# Patient Record
Sex: Male | Born: 1935 | Race: White | Hispanic: No | Marital: Married | Smoking: Former smoker
Health system: Southern US, Community
[De-identification: ages and names within clinical notes are randomized; demographics above are authoritative.]

## PROBLEM LIST (undated history)

## (undated) DIAGNOSIS — I10 Essential (primary) hypertension: Secondary | ICD-10-CM

## (undated) DIAGNOSIS — N529 Male erectile dysfunction, unspecified: Secondary | ICD-10-CM

## (undated) DIAGNOSIS — M1990 Unspecified osteoarthritis, unspecified site: Secondary | ICD-10-CM

## (undated) DIAGNOSIS — M109 Gout, unspecified: Secondary | ICD-10-CM

## (undated) DIAGNOSIS — E291 Testicular hypofunction: Secondary | ICD-10-CM

## (undated) DIAGNOSIS — Z974 Presence of external hearing-aid: Secondary | ICD-10-CM

## (undated) DIAGNOSIS — Z950 Presence of cardiac pacemaker: Secondary | ICD-10-CM

## (undated) DIAGNOSIS — N138 Other obstructive and reflux uropathy: Secondary | ICD-10-CM

## (undated) DIAGNOSIS — I4891 Unspecified atrial fibrillation: Secondary | ICD-10-CM

## (undated) DIAGNOSIS — H3530 Unspecified macular degeneration: Secondary | ICD-10-CM

## (undated) DIAGNOSIS — R001 Bradycardia, unspecified: Secondary | ICD-10-CM

## (undated) DIAGNOSIS — E785 Hyperlipidemia, unspecified: Secondary | ICD-10-CM

## (undated) DIAGNOSIS — J984 Other disorders of lung: Secondary | ICD-10-CM

## (undated) DIAGNOSIS — I429 Cardiomyopathy, unspecified: Secondary | ICD-10-CM

## (undated) DIAGNOSIS — G4733 Obstructive sleep apnea (adult) (pediatric): Secondary | ICD-10-CM

## (undated) DIAGNOSIS — J439 Emphysema, unspecified: Secondary | ICD-10-CM

## (undated) DIAGNOSIS — C4490 Unspecified malignant neoplasm of skin, unspecified: Secondary | ICD-10-CM

## (undated) HISTORY — PX: NECK SURGERY: SHX720

## (undated) HISTORY — DX: Obstructive sleep apnea (adult) (pediatric): G47.33

## (undated) HISTORY — PX: JOINT REPLACEMENT: SHX530

## (undated) HISTORY — DX: Emphysema, unspecified: J43.9

## (undated) HISTORY — DX: Unspecified macular degeneration: H35.30

## (undated) HISTORY — DX: Male erectile dysfunction, unspecified: N52.9

## (undated) HISTORY — DX: Presence of cardiac pacemaker: Z95.0

## (undated) HISTORY — DX: Unspecified osteoarthritis, unspecified site: M19.90

## (undated) HISTORY — DX: Bradycardia, unspecified: R00.1

## (undated) HISTORY — PX: EYE SURGERY: SHX253

## (undated) HISTORY — DX: Benign prostatic hyperplasia with lower urinary tract symptoms: N13.8

## (undated) HISTORY — PX: COLONOSCOPY: SHX174

## (undated) HISTORY — DX: Testicular hypofunction: E29.1

## (undated) HISTORY — PX: OTHER SURGICAL HISTORY: SHX169

## (undated) HISTORY — PX: CARDIAC PACEMAKER PLACEMENT: SHX583

## (undated) HISTORY — DX: Presence of external hearing-aid: Z97.4

## (undated) HISTORY — DX: Gout, unspecified: M10.9

## (undated) HISTORY — PX: PACEMAKER INSERTION: SHX728

## (undated) HISTORY — DX: Other disorders of lung: J98.4

## (undated) HISTORY — DX: Cardiomyopathy, unspecified: I42.9

## (undated) HISTORY — PX: REPLACEMENT TOTAL KNEE: SUR1224

## (undated) HISTORY — PX: VASECTOMY: SHX75

## (undated) HISTORY — DX: Unspecified malignant neoplasm of skin, unspecified: C44.90

## (undated) HISTORY — DX: Hyperlipidemia, unspecified: E78.5

---

## 2013-05-14 DIAGNOSIS — I872 Venous insufficiency (chronic) (peripheral): Secondary | ICD-10-CM | POA: Insufficient documentation

## 2013-05-14 DIAGNOSIS — E291 Testicular hypofunction: Secondary | ICD-10-CM | POA: Insufficient documentation

## 2013-05-14 DIAGNOSIS — N400 Enlarged prostate without lower urinary tract symptoms: Secondary | ICD-10-CM | POA: Insufficient documentation

## 2013-05-14 DIAGNOSIS — R7303 Prediabetes: Secondary | ICD-10-CM | POA: Insufficient documentation

## 2013-05-14 DIAGNOSIS — M1712 Unilateral primary osteoarthritis, left knee: Secondary | ICD-10-CM | POA: Insufficient documentation

## 2013-05-14 DIAGNOSIS — Z950 Presence of cardiac pacemaker: Secondary | ICD-10-CM | POA: Insufficient documentation

## 2013-05-14 DIAGNOSIS — K760 Fatty (change of) liver, not elsewhere classified: Secondary | ICD-10-CM | POA: Insufficient documentation

## 2013-05-14 DIAGNOSIS — K5730 Diverticulosis of large intestine without perforation or abscess without bleeding: Secondary | ICD-10-CM | POA: Insufficient documentation

## 2013-05-14 DIAGNOSIS — Z86010 Personal history of colonic polyps: Secondary | ICD-10-CM | POA: Insufficient documentation

## 2013-05-14 DIAGNOSIS — G5600 Carpal tunnel syndrome, unspecified upper limb: Secondary | ICD-10-CM | POA: Insufficient documentation

## 2013-05-14 DIAGNOSIS — I480 Paroxysmal atrial fibrillation: Secondary | ICD-10-CM | POA: Insufficient documentation

## 2013-05-14 DIAGNOSIS — I429 Cardiomyopathy, unspecified: Secondary | ICD-10-CM | POA: Insufficient documentation

## 2013-05-14 DIAGNOSIS — M109 Gout, unspecified: Secondary | ICD-10-CM | POA: Insufficient documentation

## 2013-05-14 DIAGNOSIS — N138 Other obstructive and reflux uropathy: Secondary | ICD-10-CM | POA: Insufficient documentation

## 2013-05-14 DIAGNOSIS — M5030 Other cervical disc degeneration, unspecified cervical region: Secondary | ICD-10-CM | POA: Insufficient documentation

## 2013-05-14 DIAGNOSIS — I495 Sick sinus syndrome: Secondary | ICD-10-CM | POA: Insufficient documentation

## 2013-05-14 DIAGNOSIS — I517 Cardiomegaly: Secondary | ICD-10-CM | POA: Insufficient documentation

## 2013-05-14 DIAGNOSIS — Z7901 Long term (current) use of anticoagulants: Secondary | ICD-10-CM | POA: Insufficient documentation

## 2013-08-06 DIAGNOSIS — N529 Male erectile dysfunction, unspecified: Secondary | ICD-10-CM | POA: Insufficient documentation

## 2013-10-05 DIAGNOSIS — M1710 Unilateral primary osteoarthritis, unspecified knee: Secondary | ICD-10-CM | POA: Insufficient documentation

## 2013-10-05 DIAGNOSIS — E538 Deficiency of other specified B group vitamins: Secondary | ICD-10-CM | POA: Insufficient documentation

## 2013-10-05 DIAGNOSIS — E876 Hypokalemia: Secondary | ICD-10-CM | POA: Insufficient documentation

## 2013-11-02 ENCOUNTER — Emergency Department (HOSPITAL_BASED_OUTPATIENT_CLINIC_OR_DEPARTMENT_OTHER): Payer: Medicare Other

## 2013-11-02 ENCOUNTER — Encounter (HOSPITAL_BASED_OUTPATIENT_CLINIC_OR_DEPARTMENT_OTHER): Payer: Self-pay | Admitting: Emergency Medicine

## 2013-11-02 ENCOUNTER — Emergency Department (HOSPITAL_BASED_OUTPATIENT_CLINIC_OR_DEPARTMENT_OTHER)
Admission: EM | Admit: 2013-11-02 | Discharge: 2013-11-02 | Disposition: A | Discharge status: Home / Self Care | Payer: Medicare Other | Attending: Emergency Medicine | Admitting: Emergency Medicine

## 2013-11-02 DIAGNOSIS — Z7902 Long term (current) use of antithrombotics/antiplatelets: Secondary | ICD-10-CM | POA: Diagnosis not present

## 2013-11-02 DIAGNOSIS — Z23 Encounter for immunization: Secondary | ICD-10-CM | POA: Insufficient documentation

## 2013-11-02 DIAGNOSIS — Y9289 Other specified places as the place of occurrence of the external cause: Secondary | ICD-10-CM | POA: Diagnosis not present

## 2013-11-02 DIAGNOSIS — S0990XA Unspecified injury of head, initial encounter: Secondary | ICD-10-CM | POA: Insufficient documentation

## 2013-11-02 DIAGNOSIS — Z79899 Other long term (current) drug therapy: Secondary | ICD-10-CM | POA: Diagnosis not present

## 2013-11-02 DIAGNOSIS — I10 Essential (primary) hypertension: Secondary | ICD-10-CM | POA: Insufficient documentation

## 2013-11-02 DIAGNOSIS — I4891 Unspecified atrial fibrillation: Secondary | ICD-10-CM | POA: Insufficient documentation

## 2013-11-02 DIAGNOSIS — Y9389 Activity, other specified: Secondary | ICD-10-CM | POA: Diagnosis not present

## 2013-11-02 DIAGNOSIS — Z87891 Personal history of nicotine dependence: Secondary | ICD-10-CM | POA: Diagnosis not present

## 2013-11-02 DIAGNOSIS — S0101XA Laceration without foreign body of scalp, initial encounter: Secondary | ICD-10-CM

## 2013-11-02 DIAGNOSIS — W1809XA Striking against other object with subsequent fall, initial encounter: Secondary | ICD-10-CM | POA: Diagnosis not present

## 2013-11-02 DIAGNOSIS — IMO0002 Reserved for concepts with insufficient information to code with codable children: Secondary | ICD-10-CM | POA: Insufficient documentation

## 2013-11-02 DIAGNOSIS — S80212A Abrasion, left knee, initial encounter: Secondary | ICD-10-CM

## 2013-11-02 DIAGNOSIS — W19XXXA Unspecified fall, initial encounter: Secondary | ICD-10-CM

## 2013-11-02 DIAGNOSIS — S0100XA Unspecified open wound of scalp, initial encounter: Secondary | ICD-10-CM | POA: Diagnosis not present

## 2013-11-02 HISTORY — DX: Unspecified atrial fibrillation: I48.91

## 2013-11-02 HISTORY — DX: Essential (primary) hypertension: I10

## 2013-11-02 LAB — PROTIME-INR
INR: 2.22 — ABNORMAL HIGH (ref 0.00–1.49)
Prothrombin Time: 24.6 seconds — ABNORMAL HIGH (ref 11.6–15.2)

## 2013-11-02 MED ORDER — TETANUS-DIPHTH-ACELL PERTUSSIS 5-2.5-18.5 LF-MCG/0.5 IM SUSP
0.5000 mL | Freq: Once | INTRAMUSCULAR | Status: AC
  Administered 2013-11-02: 0.5 mL via INTRAMUSCULAR
  Filled 2013-11-02: qty 0.5

## 2013-11-02 MED ORDER — TRAMADOL HCL 50 MG PO TABS: 50.0000 mg | ORAL_TABLET | Freq: Four times a day (QID) | ORAL | Status: DC | PRN

## 2013-11-02 NOTE — ED Notes (Signed)
Tripped and fell while ambulating on stone steps this am.  Scalp laceration on knee abrasion.  Denies LOC.

## 2013-11-02 NOTE — ED Provider Notes (Signed)
CSN: [id number]     Arrival date & time 11/02/13  8242 History   First MD Initiated Contact with Patient 11/02/13 [phone]     Chief Complaint  Patient presents with  . Fall     (Consider location/radiation/quality/duration/timing/severity/associated sxs/prior Treatment) Patient is a 79 y.o. male presenting with fall. The history is provided by the patient and the spouse.  Fall Associated symptoms include headaches. Pertinent negatives include no chest pain, no abdominal pain and no shortness of breath.   patient tripped and fell outside striking the left side of his for head and his left knee. Questionable brief period of loss of consciousness. Patient has a history of atrial for relation and is on a blood thinner. It is not Coumadin or warfarin. Fall occurred at 7:30 this morning. Patient able to get back up and walk on his own. No syncope episode. Patient tetanus is not up-to-date.  Past Medical History  Diagnosis Date  . Hypertension   . Atrial fibrillation    Past Surgical History  Procedure Laterality Date  . Pacemaker insertion    . Cryoablation    . Replacement total knee    . Eye surgery    . Neck surgery     No family history on file. History  Substance Use Topics  . Smoking status: Former Research scientist (life sciences)  . Smokeless tobacco: Not on file  . Alcohol Use: Yes     Comment: 2-3 drinks daily    Review of Systems  Constitutional: Negative for fever.  HENT: Negative for congestion and nosebleeds.   Eyes: Negative for visual disturbance.  Respiratory: Negative for shortness of breath.   Cardiovascular: Negative for chest pain.  Gastrointestinal: Negative for abdominal pain.  Genitourinary: Negative for hematuria.  Musculoskeletal: Negative for back pain and neck pain.  Skin: Positive for wound.  Neurological: Positive for headaches.  Hematological: Bruises/bleeds easily.  Psychiatric/Behavioral: Negative for confusion.      Allergies  Hctz and Lisinopril  Home  Medications   Prior to Admission medications   Medication Sig Start Date End Date Taking? Authorizing Provider  cholecalciferol (VITAMIN D) 1000 UNITS tablet Take 1,000 Units by mouth daily.   Yes Historical Provider, MD  Dabigatran Etexilate Mesylate (PRADAXA PO) Take by mouth.   Yes Historical Provider, MD  fluticasone (VERAMYST) 27.5 MCG/SPRAY nasal spray Place 2 sprays into the nose daily.   Yes Historical Provider, MD  losartan (COZAAR) 100 MG tablet Take 100 mg by mouth daily.   Yes Historical Provider, MD  potassium chloride (K-DUR) 10 MEQ tablet Take 10 mEq by mouth daily.   Yes Historical Provider, MD  Sildenafil Citrate (VIAGRA PO) Take by mouth.   Yes Historical Provider, MD  testosterone enanthate (DELATESTRYL) 200 MG/ML injection Inject into the muscle every 14 (fourteen) days. For IM use only   Yes Historical Provider, MD  traMADol (ULTRAM) 50 MG tablet Take 1 tablet (50 mg total) by mouth every 6 (six) hours as needed. 11/02/13   Fredia Sorrow, MD   BP 134/63  Pulse 74  Temp(Src) 98.4 F (36.9 C) (Oral)  Resp 18  SpO2 94% Physical Exam  Nursing note and vitals reviewed. Constitutional: He is oriented to person, place, and time. He appears well-developed and well-nourished.  HENT:  Head: Normocephalic.  Mouth/Throat: Oropharynx is clear and moist.  Left fore head temporal area with 5 cm laceration linear irregular no step off bleeding reasonably controlled. No other facial injuries  Eyes: Conjunctivae and EOM are normal. Pupils are equal, round,  and reactive to light.  Neck: Normal range of motion.  Cardiovascular: Normal rate and regular rhythm.   No murmur heard. Pulmonary/Chest: Effort normal and breath sounds normal.  Abdominal: Soft. Bowel sounds are normal.  Musculoskeletal: Normal range of motion. He exhibits edema and tenderness.  5-6 cm abrasion to left anterior knee with some swelling no dislocation good range of motion.  Neurological: He is alert and  oriented to person, place, and time. No cranial nerve deficit. He exhibits normal muscle tone. Coordination normal.  Skin: Skin is warm. No rash noted.    ED Course  Procedures (including critical care time) Labs Review Labs Reviewed  PROTIME-INR - Abnormal; Notable for the following:    Prothrombin Time 24.6 (*)    INR 2.22 (*)    All other components within normal limits    Imaging Review Ct Head Wo Contrast  11/02/2013   CLINICAL DATA:  Fall, forehead laceration next  EXAM: CT HEAD WITHOUT CONTRAST  CT CERVICAL SPINE WITHOUT CONTRAST  TECHNIQUE: Multidetector CT imaging of the head and cervical spine was performed following the standard protocol without intravenous contrast. Multiplanar CT image reconstructions of the cervical spine were also generated.  COMPARISON:  Prior MRI cervical spine 08/10/2008  FINDINGS: CT HEAD FINDINGS  Negative for acute intracranial hemorrhage, acute infarction, mass, mass effect, hydrocephalus or midline shift. Gray-white differentiation is preserved throughout. Very mild cerebral volume loss. Trace periventricular white matter hypoattenuation consistent with longstanding microvascular ischemia. Soft tissue defect left frontotemporal scalp consistent with laceration. There is minimal underlying scalp hematoma. No calvarial fracture. Hyperostosis frontalis interna. Normal aeration of the mastoid air cells and visualized paranasal sinuses.  CT CERVICAL SPINE FINDINGS  No acute fracture, malalignment or prevertebral soft tissue swelling. Posterior cervical fusion with bilateral pedicle screw and rod construct spanning C3- C6. Laminectomies are present at each level of the fused segment. No evidence of hardware fracture or malalignment. Multilevel cervical spondylosis. No interval change in alignment comparing across modalities to the prior study. Persistent mild cervical stenosis at C2-C3 and C6-C7. Unremarkable CT appearance of the thyroid gland. No acute soft tissue  abnormality. The lung apices are unremarkable.  IMPRESSION: CT HEAD  1. No acute intracranial abnormality. 2. Left frontotemporal laceration with mild underlying hematoma. No evidence of calvarial fracture. 3. Very mild atrophy and sequelae of chronic microvascular ischemic white matter disease. CT CSPINE  1. No acute fracture or malalignment. 2. Surgical changes of prior C3-C6 multilevel laminectomies and posterior fusion without evidence of hardware complication.   Electronically Signed   By: Jacqulynn Cadet M.D.   On: 11/02/2013 09:35   Ct Cervical Spine Wo Contrast  11/02/2013   CLINICAL DATA:  Fall, forehead laceration next  EXAM: CT HEAD WITHOUT CONTRAST  CT CERVICAL SPINE WITHOUT CONTRAST  TECHNIQUE: Multidetector CT imaging of the head and cervical spine was performed following the standard protocol without intravenous contrast. Multiplanar CT image reconstructions of the cervical spine were also generated.  COMPARISON:  Prior MRI cervical spine 08/10/2008  FINDINGS: CT HEAD FINDINGS  Negative for acute intracranial hemorrhage, acute infarction, mass, mass effect, hydrocephalus or midline shift. Gray-white differentiation is preserved throughout. Very mild cerebral volume loss. Trace periventricular white matter hypoattenuation consistent with longstanding microvascular ischemia. Soft tissue defect left frontotemporal scalp consistent with laceration. There is minimal underlying scalp hematoma. No calvarial fracture. Hyperostosis frontalis interna. Normal aeration of the mastoid air cells and visualized paranasal sinuses.  CT CERVICAL SPINE FINDINGS  No acute fracture, malalignment or prevertebral  soft tissue swelling. Posterior cervical fusion with bilateral pedicle screw and rod construct spanning C3- C6. Laminectomies are present at each level of the fused segment. No evidence of hardware fracture or malalignment. Multilevel cervical spondylosis. No interval change in alignment comparing across  modalities to the prior study. Persistent mild cervical stenosis at C2-C3 and C6-C7. Unremarkable CT appearance of the thyroid gland. No acute soft tissue abnormality. The lung apices are unremarkable.  IMPRESSION: CT HEAD  1. No acute intracranial abnormality. 2. Left frontotemporal laceration with mild underlying hematoma. No evidence of calvarial fracture. 3. Very mild atrophy and sequelae of chronic microvascular ischemic white matter disease. CT CSPINE  1. No acute fracture or malalignment. 2. Surgical changes of prior C3-C6 multilevel laminectomies and posterior fusion without evidence of hardware complication.   Electronically Signed   By: Jacqulynn Cadet M.D.   On: 11/02/2013 09:35   Dg Knee Complete 4 Views Left  11/02/2013   CLINICAL DATA:  Anterior knee laceration, fall  EXAM: LEFT KNEE - COMPLETE 4+ VIEW  COMPARISON:  None.  FINDINGS: Moderately severe tricompartmental osteoarthritis, most pronounced in the lateral and patellofemoral compartments which demonstrate joint space loss, sclerosis and bony spurring. Normal alignment without fracture. Moderate joint effusion on the lateral view.  IMPRESSION: Osteoarthritis.  Joint effusion.  No acute osseous finding or fracture.   Electronically Signed   By: Daryll Brod M.D.   On: 11/02/2013 09:20     EKG Interpretation None     LACERATION REPAIR Performed by: Fredia Sorrow Authorized by: Fredia Sorrow Consent: Verbal consent obtained. Risks and benefits: risks, benefits and alternatives were discussed Consent given by: patient Patient identity confirmed: provided demographic data Prepped and Draped in normal sterile fashion Wound explored  Laceration Location: Left frontal temporal area of the scalp  Laceration Length: 5 cm  No Foreign Bodies seen or palpated  Anesthesia: local infiltration  Local anesthetic: None patient opted for none  Anesthetic total: None   Irrigation method: syringe Amount of cleaning:  standard  Skin closure: Skin staples   Number of sutures: 5   Technique: Simple interrupted   Patient tolerance: Patient tolerated the procedure well with no immediate complications.  MDM   Final diagnoses:  Fall, initial encounter  Scalp laceration, initial encounter  Head injury, initial encounter  Knee abrasion, left, initial encounter    Patient status post fall with head injury and left knee injury. Patient with significant laceration to the left frontal temporal area of the scalp laceration measuring about 5 cm. Head CT was negative, CT cervical spine negative, x-ray of left knee where there is an abrasion without acute injury does show some effusion could be related to the fall or could be related to his history of osteoarthritis. Patient without any other significant injuries. Patient given precautions to return for any new or worsening symptoms symptoms since he had a significant injury to the head and is on blood thinners for atrial fibrillation.    Fredia Sorrow, MD 11/02/13 1158

## 2013-11-02 NOTE — ED Notes (Signed)
Patient transported to CT 

## 2013-11-02 NOTE — Discharge Instructions (Signed)
Wound care as we discussed. Keep the scalp wound dry for 24 hours. Then you can shower in dress daily with an antibiotic ointment. Staple removal in 7-10 days. Return for any newer worse symptoms to include severe headache nausea vomiting or any confusion. Results of your head CT and x-rays provided.

## 2013-11-11 DIAGNOSIS — Z91030 Bee allergy status: Secondary | ICD-10-CM | POA: Insufficient documentation

## 2014-01-23 ENCOUNTER — Encounter (HOSPITAL_BASED_OUTPATIENT_CLINIC_OR_DEPARTMENT_OTHER): Payer: Self-pay | Admitting: *Deleted

## 2014-01-23 ENCOUNTER — Emergency Department (HOSPITAL_BASED_OUTPATIENT_CLINIC_OR_DEPARTMENT_OTHER): Payer: Medicare Other

## 2014-01-23 ENCOUNTER — Emergency Department (HOSPITAL_BASED_OUTPATIENT_CLINIC_OR_DEPARTMENT_OTHER)
Admission: EM | Admit: 2014-01-23 | Discharge: 2014-01-23 | Disposition: A | Discharge status: Home / Self Care | Payer: Medicare Other | Attending: Emergency Medicine | Admitting: Emergency Medicine

## 2014-01-23 DIAGNOSIS — Z950 Presence of cardiac pacemaker: Secondary | ICD-10-CM | POA: Diagnosis not present

## 2014-01-23 DIAGNOSIS — Z79899 Other long term (current) drug therapy: Secondary | ICD-10-CM | POA: Diagnosis not present

## 2014-01-23 DIAGNOSIS — Z7951 Long term (current) use of inhaled steroids: Secondary | ICD-10-CM | POA: Diagnosis not present

## 2014-01-23 DIAGNOSIS — Z87891 Personal history of nicotine dependence: Secondary | ICD-10-CM | POA: Insufficient documentation

## 2014-01-23 DIAGNOSIS — I10 Essential (primary) hypertension: Secondary | ICD-10-CM | POA: Insufficient documentation

## 2014-01-23 DIAGNOSIS — Z96659 Presence of unspecified artificial knee joint: Secondary | ICD-10-CM | POA: Insufficient documentation

## 2014-01-23 DIAGNOSIS — Z8739 Personal history of other diseases of the musculoskeletal system and connective tissue: Secondary | ICD-10-CM

## 2014-01-23 DIAGNOSIS — M25572 Pain in left ankle and joints of left foot: Secondary | ICD-10-CM | POA: Diagnosis present

## 2014-01-23 DIAGNOSIS — R52 Pain, unspecified: Secondary | ICD-10-CM

## 2014-01-23 MED ORDER — PREDNISONE 10 MG PO TABS: 20.0000 mg | ORAL_TABLET | Freq: Two times a day (BID) | ORAL | Status: DC

## 2014-01-23 MED ORDER — HYDROCODONE-ACETAMINOPHEN 5-325 MG PO TABS: 2.0000 | ORAL_TABLET | ORAL | Status: DC | PRN

## 2014-01-23 NOTE — ED Provider Notes (Signed)
CSN: [id number]     Arrival date & time 01/23/14  1004 History   First MD Initiated Contact with Patient 01/23/14 1042     Chief Complaint  Patient presents with  . Ankle Pain     (Consider location/radiation/quality/duration/timing/severity/associated sxs/prior Treatment) HPI Comments: Patient is a 79 year old male with history of pacemaker, total knee replacement. He presents today with complaints of left foot and ankle pain. This is been worsening over the past 3 days and is worse with bearing weight. It is improved with rest. He has had gout in the past and believes that is what is happening today. He denies any injury or trauma.  Patient is a 79 y.o. male presenting with ankle pain. The history is provided by the patient.  Ankle Pain Location:  Foot Time since incident:  3 days Injury: no   Foot location:  L foot Pain details:    Severity:  Moderate   Onset quality:  Gradual   Duration:  3 days   Timing:  Constant   Progression:  Worsening Chronicity:  New   Past Medical History  Diagnosis Date  . Hypertension   . Atrial fibrillation    Past Surgical History  Procedure Laterality Date  . Pacemaker insertion    . Cryoablation    . Replacement total knee    . Eye surgery    . Neck surgery     No family history on file. History  Substance Use Topics  . Smoking status: Former Research scientist (life sciences)  . Smokeless tobacco: Not on file  . Alcohol Use: Yes     Comment: 2-3 drinks daily    Review of Systems  All other systems reviewed and are negative.     Allergies  Hctz and Lisinopril  Home Medications   Prior to Admission medications   Medication Sig Start Date End Date Taking? Authorizing Provider  cholecalciferol (VITAMIN D) 1000 UNITS tablet Take 1,000 Units by mouth daily.    Historical Provider, MD  Dabigatran Etexilate Mesylate (PRADAXA PO) Take by mouth.    Historical Provider, MD  fluticasone (VERAMYST) 27.5 MCG/SPRAY nasal spray Place 2 sprays into the nose  daily.    Historical Provider, MD  losartan (COZAAR) 100 MG tablet Take 100 mg by mouth daily.    Historical Provider, MD  potassium chloride (K-DUR) 10 MEQ tablet Take 10 mEq by mouth daily.    Historical Provider, MD  Sildenafil Citrate (VIAGRA PO) Take by mouth.    Historical Provider, MD  testosterone enanthate (DELATESTRYL) 200 MG/ML injection Inject into the muscle every 14 (fourteen) days. For IM use only    Historical Provider, MD  traMADol (ULTRAM) 50 MG tablet Take 1 tablet (50 mg total) by mouth every 6 (six) hours as needed. 11/02/13   Fredia Sorrow, MD   BP 144/83 mmHg  Pulse 75  Temp(Src) 98 F (36.7 C) (Oral)  Resp 20  Ht 6\' 1"  (1.854 m)  Wt 250 lb (113.399 kg)  BMI 32.99 kg/m2  SpO2 96% Physical Exam  Constitutional: He is oriented to person, place, and time. He appears well-developed and well-nourished. No distress.  HENT:  Head: Normocephalic and atraumatic.  Neck: Normal range of motion. Neck supple.  Musculoskeletal: Normal range of motion. He exhibits no edema.  The left foot an ankle appear grossly normal.  There is tenderness to palpation around the soft tissues inferior to the medial and lateral malleolus. There is pain with range of motion of the ankle, however no warmth or palpable  joint effusion.  Neurological: He is alert and oriented to person, place, and time.  Skin: Skin is warm and dry. He is not diaphoretic.  Nursing note and vitals reviewed.   ED Course  Procedures (including critical care time) Labs Review Labs Reviewed - No data to display  Imaging Review Dg Ankle Complete Left  01/23/2014   CLINICAL DATA:  79 year old male with lateral left ankle pain and swelling. No known injury.  EXAM: LEFT ANKLE COMPLETE - 3+ VIEW  COMPARISON:  None.  FINDINGS: Diffuse mild soft tissue swelling about the ankle without evidence of acute fracture or malalignment. The ankle mortise remains congruent and the talar dome intact. Normal bony mineralization without  evidence of lytic or blastic osseous lesion. No ankle joint effusion. Mildly prominent posterior talar process. Small posterior and plantar calcaneal spurs. Trace atherosclerotic calcifications noted in the posterior tibial and dorsalis pedis arteries. Mild degenerative change in the mid foot at the talonavicular joint.  IMPRESSION: 1. Mild soft tissue swelling about the ankle without evidence of acute osseous abnormality or ankle joint effusion. 2. Mild midfoot degenerative osteoarthritis. 3. Mild atherosclerotic vascular calcifications.   Electronically Signed   By: Jacqulynn Cadet M.D.   On: 01/23/2014 10:33     EKG Interpretation None      MDM   Final diagnoses:  Pain    X-rays are negative. I will treat for possible gout with prednisone and when necessary follow-up.    Veryl Speak, MD 01/23/14 1054

## 2014-01-23 NOTE — ED Notes (Signed)
Left ankle pain and swelling. Denies recent injury

## 2014-01-23 NOTE — Discharge Instructions (Signed)
Prednisone as prescribed.  Hydrocodone as prescribed as needed for pain.  Follow-up with your primary Dr. If not improving in the next week, and return to the ER if your symptoms substantially worsen or change.   Ankle Pain Ankle pain is a common symptom. The bones, cartilage, tendons, and muscles of the ankle joint perform a lot of work each day. The ankle joint holds your body weight and allows you to move around. Ankle pain can occur on either side or back of 1 or both ankles. Ankle pain may be sharp and burning or dull and aching. There may be tenderness, stiffness, redness, or warmth around the ankle. The pain occurs more often when a person walks or puts pressure on the ankle. CAUSES  There are many reasons ankle pain can develop. It is important to work with your caregiver to identify the cause since many conditions can impact the bones, cartilage, muscles, and tendons. Causes for ankle pain include:  Injury, including a break (fracture), sprain, or strain often due to a fall, sports, or a high-impact activity.  Swelling (inflammation) of a tendon (tendonitis).  Achilles tendon rupture.  Ankle instability after repeated sprains and strains.  Poor foot alignment.  Pressure on a nerve (tarsal tunnel syndrome).  Arthritis in the ankle or the lining of the ankle.  Crystal formation in the ankle (gout or pseudogout). DIAGNOSIS  A diagnosis is based on your medical history, your symptoms, results of your physical exam, and results of diagnostic tests. Diagnostic tests may include X-ray exams or a computerized magnetic scan (magnetic resonance imaging, MRI). TREATMENT  Treatment will depend on the cause of your ankle pain and may include:  Keeping pressure off the ankle and limiting activities.  Using crutches or other walking support (a cane or brace).  Using rest, ice, compression, and elevation.  Participating in physical therapy or home exercises.  Wearing shoe inserts or  special shoes.  Losing weight.  Taking medications to reduce pain or swelling or receiving an injection.  Undergoing surgery. HOME CARE INSTRUCTIONS   Only take over-the-counter or prescription medicines for pain, discomfort, or fever as directed by your caregiver.  Put ice on the injured area.  Put ice in a plastic bag.  Place a towel between your skin and the bag.  Leave the ice on for 15-20 minutes at a time, 03-04 times a day.  Keep your leg raised (elevated) when possible to lessen swelling.  Avoid activities that cause ankle pain.  Follow specific exercises as directed by your caregiver.  Record how often you have ankle pain, the location of the pain, and what it feels like. This information may be helpful to you and your caregiver.  Ask your caregiver about returning to work or sports and whether you should drive.  Follow up with your caregiver for further examination, therapy, or testing as directed. SEEK MEDICAL CARE IF:   Pain or swelling continues or worsens beyond 1 week.  You have an oral temperature above 102 F (38.9 C).  You are feeling unwell or have chills.  You are having an increasingly difficult time with walking.  You have loss of sensation or other new symptoms.  You have questions or concerns. MAKE SURE YOU:   Understand these instructions.  Will watch your condition.  Will get help right away if you are not doing well or get worse. Document Released: 08/22/2009 Document Revised: 05/27/2011 Document Reviewed: 08/22/2009 [facility] Patient Information 2015 [place], [place]. This information is not intended  to replace advice given to you by your health care provider. Make sure you discuss any questions you have with your health care provider. ° °

## 2014-02-21 DIAGNOSIS — I10 Essential (primary) hypertension: Secondary | ICD-10-CM | POA: Insufficient documentation

## 2014-02-24 DIAGNOSIS — M19072 Primary osteoarthritis, left ankle and foot: Secondary | ICD-10-CM | POA: Insufficient documentation

## 2014-08-23 DIAGNOSIS — E785 Hyperlipidemia, unspecified: Secondary | ICD-10-CM | POA: Insufficient documentation

## 2014-10-31 DIAGNOSIS — G4733 Obstructive sleep apnea (adult) (pediatric): Secondary | ICD-10-CM | POA: Insufficient documentation

## 2015-08-30 IMAGING — CR DG ANKLE COMPLETE 3+V*L*
3 series · 3 of 3 positions shown · non-contrast
Comparison: None.

CLINICAL DATA: 79-year-old male with lateral left ankle pain and
swelling. No known injury.

EXAM:
LEFT ANKLE COMPLETE - 3+ VIEW

[t ankle joint ap left]
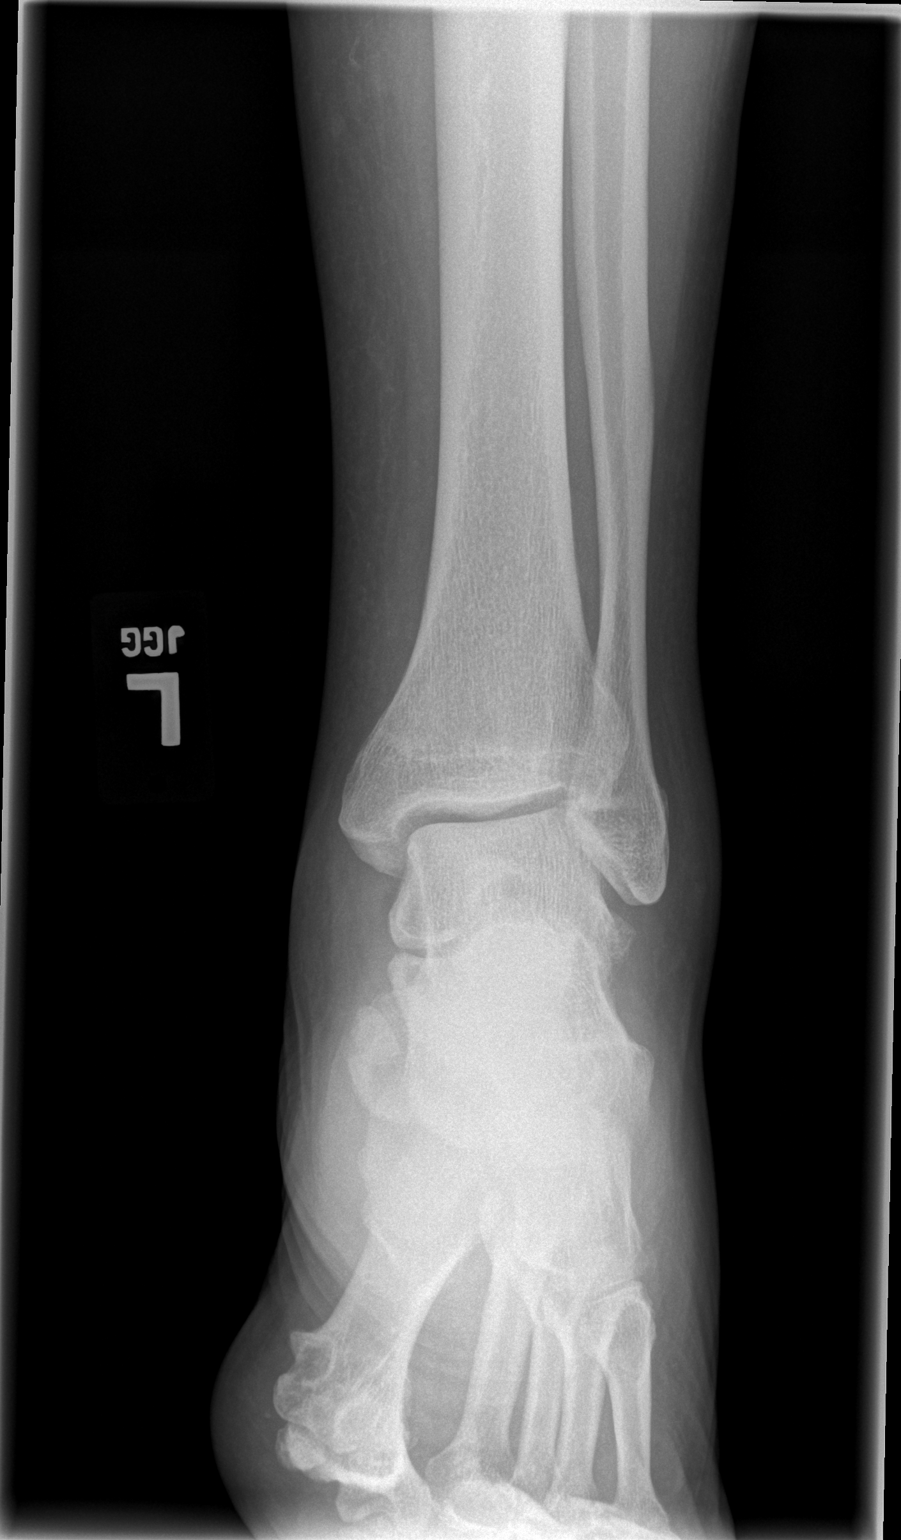

[t ankle joint oblique left]
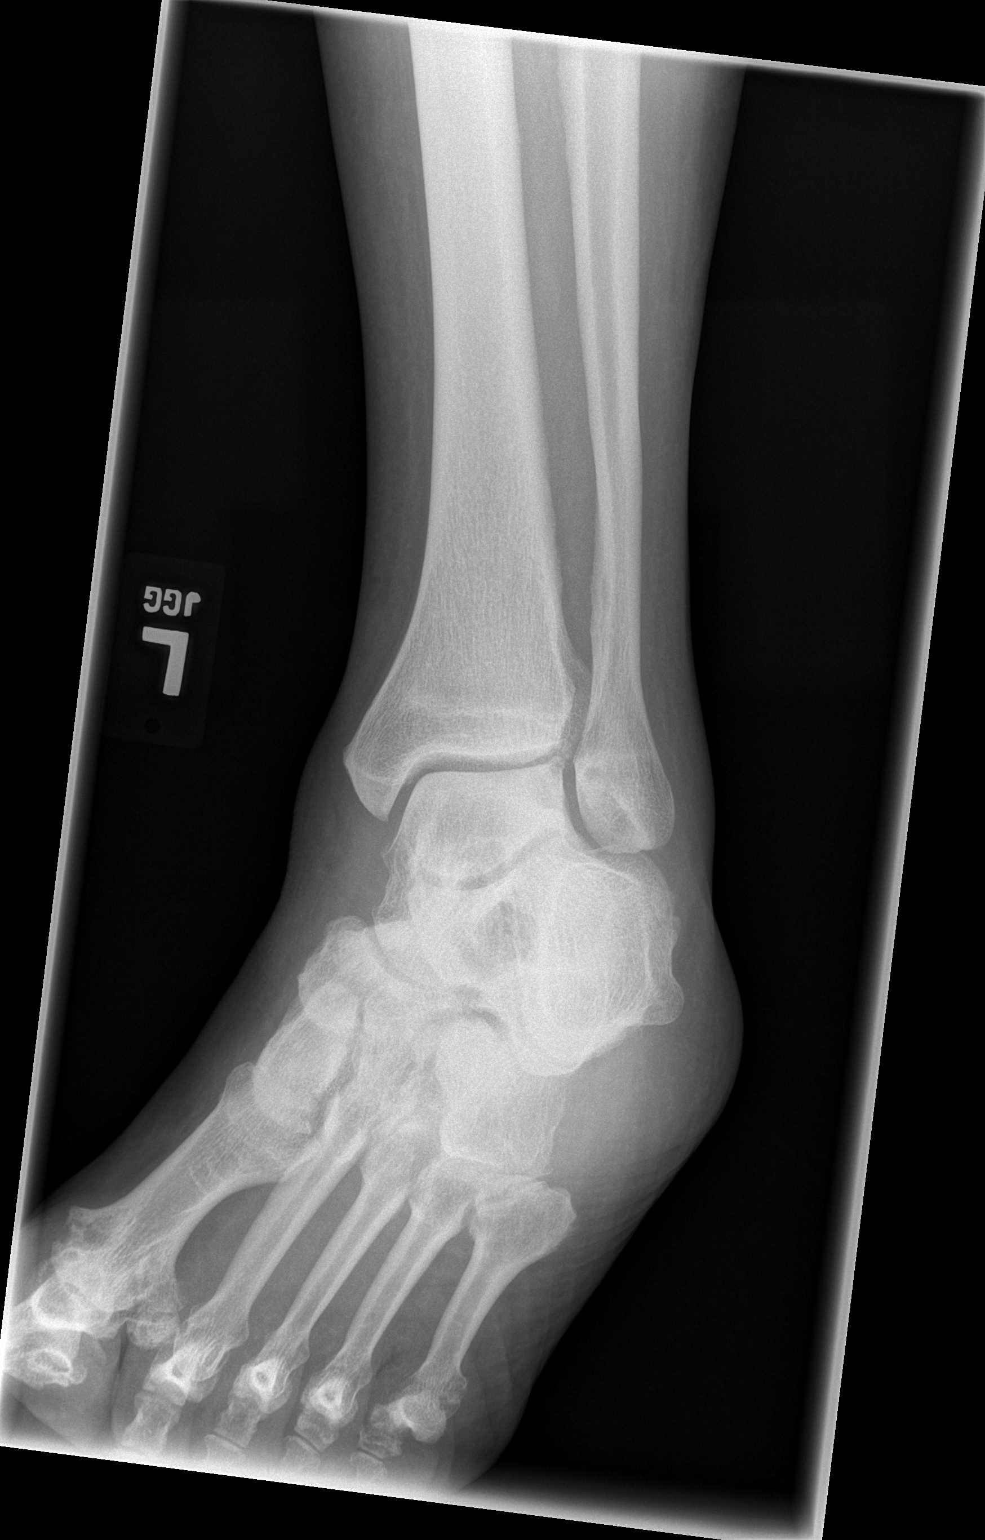

[t ankle joint lat left]
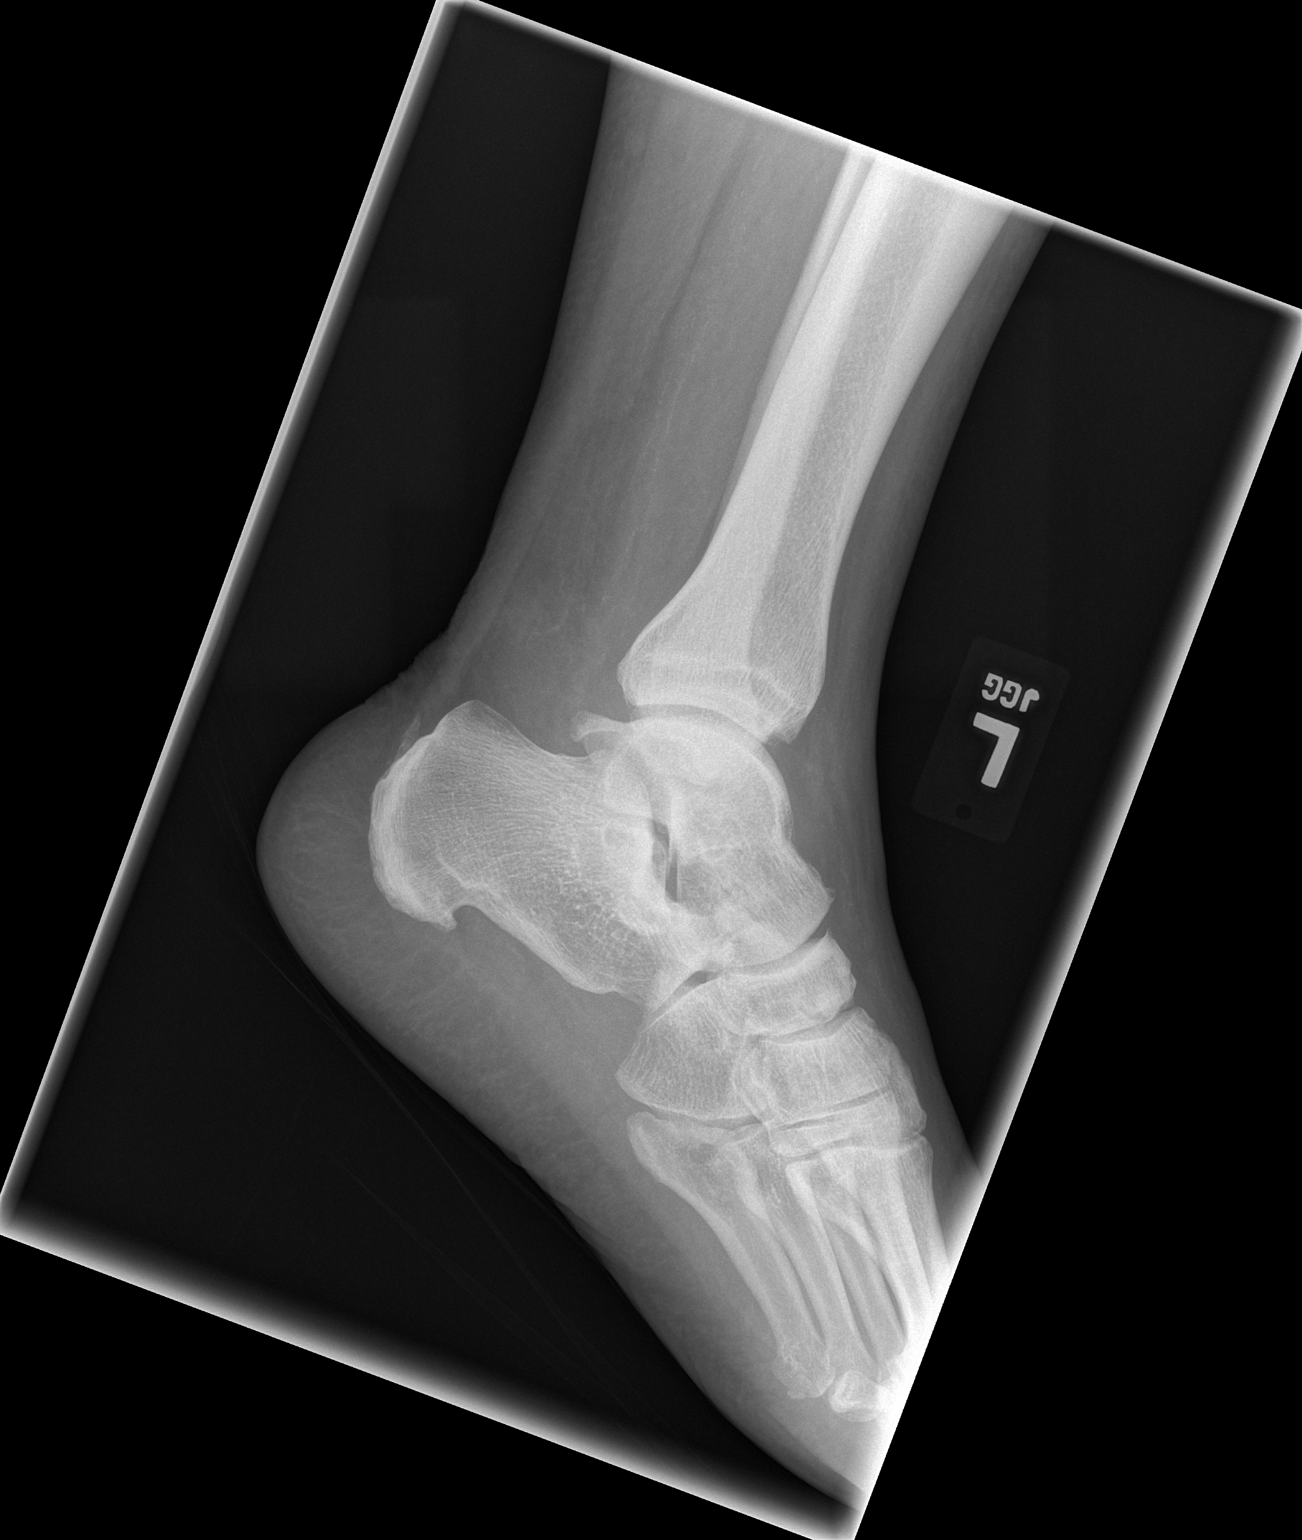

[3 of 3 positions shown; findings below may reference images not displayed]

FINDINGS: Diffuse mild soft tissue swelling about the ankle without evidence
of acute fracture or malalignment. The ankle mortise remains
congruent and the talar dome intact. Normal bony mineralization
without evidence of lytic or blastic osseous lesion. No ankle joint
effusion. Mildly prominent posterior talar process. Small posterior
and plantar calcaneal spurs. Trace atherosclerotic calcifications
noted in the posterior tibial and dorsalis pedis arteries. Mild
degenerative change in the mid foot at the talonavicular joint.
IMPRESSION: 1. Mild soft tissue swelling about the ankle without evidence of
acute osseous abnormality or ankle joint effusion.
2. Mild midfoot degenerative osteoarthritis.
3. Mild atherosclerotic vascular calcifications.

## 2016-12-17 DIAGNOSIS — Z45018 Encounter for adjustment and management of other part of cardiac pacemaker: Secondary | ICD-10-CM | POA: Insufficient documentation

## 2017-05-25 ENCOUNTER — Encounter (HOSPITAL_BASED_OUTPATIENT_CLINIC_OR_DEPARTMENT_OTHER): Payer: Self-pay | Admitting: Emergency Medicine

## 2017-05-25 ENCOUNTER — Emergency Department (HOSPITAL_BASED_OUTPATIENT_CLINIC_OR_DEPARTMENT_OTHER)
Admission: EM | Admit: 2017-05-25 | Discharge: 2017-05-25 | Disposition: A | Discharge status: Home / Self Care | Payer: Medicare Other | Attending: Emergency Medicine | Admitting: Emergency Medicine

## 2017-05-25 ENCOUNTER — Emergency Department (HOSPITAL_BASED_OUTPATIENT_CLINIC_OR_DEPARTMENT_OTHER): Payer: Medicare Other

## 2017-05-25 ENCOUNTER — Other Ambulatory Visit: Payer: Self-pay

## 2017-05-25 DIAGNOSIS — I4891 Unspecified atrial fibrillation: Secondary | ICD-10-CM | POA: Diagnosis not present

## 2017-05-25 DIAGNOSIS — Z79899 Other long term (current) drug therapy: Secondary | ICD-10-CM | POA: Diagnosis not present

## 2017-05-25 DIAGNOSIS — I10 Essential (primary) hypertension: Secondary | ICD-10-CM | POA: Diagnosis not present

## 2017-05-25 DIAGNOSIS — Z87891 Personal history of nicotine dependence: Secondary | ICD-10-CM | POA: Diagnosis not present

## 2017-05-25 DIAGNOSIS — Z950 Presence of cardiac pacemaker: Secondary | ICD-10-CM | POA: Diagnosis not present

## 2017-05-25 DIAGNOSIS — R002 Palpitations: Secondary | ICD-10-CM | POA: Diagnosis present

## 2017-05-25 LAB — CBC WITH DIFFERENTIAL/PLATELET
BASOS PCT: 0 %
Basophils Absolute: 0 10*3/uL (ref 0.0–0.1)
EOS ABS: 0.2 10*3/uL (ref 0.0–0.7)
EOS PCT: 2 %
HEMATOCRIT: 40.6 % (ref 39.0–52.0)
Hemoglobin: 14.2 g/dL (ref 13.0–17.0)
Lymphocytes Relative: 12 %
Lymphs Abs: 1.3 10*3/uL (ref 0.7–4.0)
MCH: 33.5 pg (ref 26.0–34.0)
MCHC: 35 g/dL (ref 30.0–36.0)
MCV: 95.8 fL (ref 78.0–100.0)
MONO ABS: 0.7 10*3/uL (ref 0.1–1.0)
Monocytes Relative: 7 %
Neutro Abs: 8 10*3/uL — ABNORMAL HIGH (ref 1.7–7.7)
Neutrophils Relative %: 79 %
PLATELETS: 162 10*3/uL (ref 150–400)
RBC: 4.24 MIL/uL (ref 4.22–5.81)
RDW: 13.5 % (ref 11.5–15.5)
WBC: 10.1 10*3/uL (ref 4.0–10.5)

## 2017-05-25 LAB — COMPREHENSIVE METABOLIC PANEL
ALBUMIN: 4.4 g/dL (ref 3.5–5.0)
ALT: 26 U/L (ref 17–63)
ANION GAP: 10 (ref 5–15)
AST: 28 U/L (ref 15–41)
Alkaline Phosphatase: 97 U/L (ref 38–126)
BUN: 19 mg/dL (ref 6–20)
CHLORIDE: 104 mmol/L (ref 101–111)
CO2: 23 mmol/L (ref 22–32)
Calcium: 10.7 mg/dL — ABNORMAL HIGH (ref 8.9–10.3)
Creatinine, Ser: 1.18 mg/dL (ref 0.61–1.24)
GFR calc Af Amer: >60 mL/min (ref >60)
GFR calc non Af Amer: 56 mL/min — ABNORMAL LOW (ref >60)
GLUCOSE: 121 mg/dL — AB (ref 65–99)
POTASSIUM: 4.2 mmol/L (ref 3.5–5.1)
SODIUM: 137 mmol/L (ref 135–145)
TOTAL PROTEIN: 7.3 g/dL (ref 6.5–8.1)
Total Bilirubin: 2.1 mg/dL — ABNORMAL HIGH (ref 0.3–1.2)

## 2017-05-25 LAB — MAGNESIUM: Magnesium: 2 mg/dL (ref 1.7–2.4)

## 2017-05-25 LAB — TROPONIN I: Troponin I: 0.1 ng/mL (ref <0.03)

## 2017-05-25 MED ORDER — AMIODARONE IV BOLUS ONLY 150 MG/100ML
150.0000 mg | Freq: Once | INTRAVENOUS | Status: AC
  Administered 2017-05-25: 150 mg via INTRAVENOUS
  Filled 2017-05-25: qty 100

## 2017-05-25 MED ORDER — APIXABAN 5 MG PO TABS
5.0000 mg | ORAL_TABLET | Freq: Once | ORAL | Status: DC
  Filled 2017-05-25: qty 1

## 2017-05-25 MED ORDER — APIXABAN 5 MG PO TABS: 5.0000 mg | ORAL_TABLET | Freq: Two times a day (BID) | ORAL | 0 refills | Status: DC

## 2017-05-25 MED ORDER — AMIODARONE HCL 200 MG PO TABS: 200.0000 mg | ORAL_TABLET | Freq: Every day | ORAL | 0 refills | Status: DC

## 2017-05-25 MED ORDER — SODIUM CHLORIDE 0.9 % IV SOLN
INTRAVENOUS | Status: DC
  Administered 2017-05-25: 14:00:00 via INTRAVENOUS

## 2017-05-25 MED ORDER — DILTIAZEM HCL 100 MG IV SOLR
5.0000 mg/h | INTRAVENOUS | Status: DC
  Administered 2017-05-25: 5 mg/h via INTRAVENOUS
  Filled 2017-05-25: qty 100

## 2017-05-25 MED ORDER — DILTIAZEM LOAD VIA INFUSION
10.0000 mg | Freq: Once | INTRAVENOUS | Status: AC
  Administered 2017-05-25: 10 mg via INTRAVENOUS
  Filled 2017-05-25: qty 10

## 2017-05-25 NOTE — ED Notes (Signed)
Troponin 0.10 , given to ED MD

## 2017-05-25 NOTE — ED Notes (Signed)
Pt placed on O2 2L [place]; no resp distress noted, but O2 sats 90-91% with good wave form.

## 2017-05-25 NOTE — ED Provider Notes (Signed)
[facility] EMERGENCY DEPARTMENT Provider Note   CSN: [id number] Arrival date & time: 05/25/17  1238     History   Chief Complaint Chief Complaint  Patient presents with  . Palpitations    HPI Christian Lin is a 83 y.o. male.  The history is provided by the patient.  Palpitations   This is a new problem. Episode onset: present when waking up this morning. The problem occurs constantly. The problem has not changed since onset.The problem is associated with an unknown factor. On average, each episode lasts 6 hours. Pertinent negatives include no diaphoresis, no fever, no chest pain, no chest pressure, no exertional chest pressure, no near-syncope, no syncope, no abdominal pain, no nausea, no vomiting, no headaches, no leg pain, no lower extremity edema, no weakness, no cough and no shortness of breath. He has tried nothing for the symptoms. The treatment provided no relief. Risk factors: hx of PAF but had not had an episode in so long took off rate control and blood thinners 1 year ago. Past medical history comments: hx of PAF, pacemaker, HTN.    Past Medical History:  Diagnosis Date  . Atrial fibrillation ([place])   . Hypertension     There are no active problems to display for this patient.   Past Surgical History:  Procedure Laterality Date  . cryoablation    . EYE SURGERY    . NECK SURGERY    . PACEMAKER INSERTION    . REPLACEMENT TOTAL KNEE         Home Medications    Prior to Admission medications   Medication Sig Start Date End Date Taking? Authorizing Provider  amLODipine (NORVASC) 5 MG tablet Take 5 mg by mouth daily.   Yes [provider]  atorvastatin (LIPITOR) 40 MG tablet Take 40 mg by mouth daily.   Yes [provider]  finasteride (PROSCAR) 5 MG tablet Take 5 mg by mouth daily.   Yes [provider]  furosemide (LASIX) 20 MG tablet Take 20 mg by mouth.   Yes [provider]  cholecalciferol (VITAMIN D)  1000 UNITS tablet Take 1,000 Units by mouth daily.    [provider]  Dabigatran Etexilate Mesylate (PRADAXA PO) Take by mouth.    [provider]  fluticasone (VERAMYST) 27.5 MCG/SPRAY nasal spray Place 2 sprays into the nose daily.    [provider]  HYDROcodone-acetaminophen (NORCO) 5-325 MG per tablet Take 2 tablets by mouth every 4 (four) hours as needed. 01/23/14   Veryl Speak, MD  losartan (COZAAR) 100 MG tablet Take 100 mg by mouth daily.    [provider]  potassium chloride (K-DUR) 10 MEQ tablet Take 10 mEq by mouth daily.    [provider]  predniSONE (DELTASONE) 10 MG tablet Take 2 tablets (20 mg total) by mouth 2 (two) times daily. 01/23/14   Veryl Speak, MD  Sildenafil Citrate (VIAGRA PO) Take by mouth.    [provider]  testosterone enanthate (DELATESTRYL) 200 MG/ML injection Inject into the muscle every 14 (fourteen) days. For IM use only    [provider]  traMADol (ULTRAM) 50 MG tablet Take 1 tablet (50 mg total) by mouth every 6 (six) hours as needed. 11/02/13   Fredia Sorrow, MD    Family History No family history on file.  Social History Social History   Tobacco Use  . Smoking status: Former Research scientist (life sciences)  . Smokeless tobacco: Never Used  Substance Use Topics  . Alcohol use:  Yes    Comment: 2-3 drinks daily  . Drug use: No     Allergies   Hctz [hydrochlorothiazide] and Lisinopril   Review of Systems Review of Systems  Constitutional: Negative for diaphoresis and fever.  Respiratory: Negative for cough and shortness of breath.   Cardiovascular: Positive for palpitations. Negative for chest pain, syncope and near-syncope.  Gastrointestinal: Negative for abdominal pain, nausea and vomiting.  Neurological: Negative for weakness and headaches.  All other systems reviewed and are negative.    Physical Exam Updated Vital Signs BP 129/85   Pulse (!) 125   Temp 98.1 F (36.7 C) (Oral)    Resp 20   Ht 6\' 2"  (1.88 m)   Wt 115.7 kg (255 lb)   SpO2 98%   BMI 32.74 kg/m   Physical Exam  Constitutional: He is oriented to person, place, and time. He appears well-developed and well-nourished. No distress.  HENT:  Head: Normocephalic and atraumatic.  Mouth/Throat: Oropharynx is clear and moist.  Eyes: Conjunctivae and EOM are normal. Pupils are equal, round, and reactive to light.  Neck: Normal range of motion. Neck supple.  Cardiovascular: Regular rhythm and intact distal pulses. Tachycardia present.  No murmur heard. Pulmonary/Chest: Effort normal and breath sounds normal. No respiratory distress. He has no wheezes. He has no rales.  Abdominal: Soft. He exhibits no distension. There is no tenderness. There is no rebound and no guarding.  Musculoskeletal: Normal range of motion. He exhibits no edema or tenderness.  Neurological: He is alert and oriented to person, place, and time.  Skin: Skin is warm and dry. No rash noted. No erythema.  Psychiatric: He has a normal mood and affect. His behavior is normal.  Nursing note and vitals reviewed.    ED Treatments / Results  Labs (all labs ordered are listed, but only abnormal results are displayed) Labs Reviewed  CBC WITH DIFFERENTIAL/PLATELET - Abnormal; Notable for the following components:      Result Value   Neutro Abs 8.0 (*)    All other components within normal limits  COMPREHENSIVE METABOLIC PANEL - Abnormal; Notable for the following components:   Glucose, Bld 121 (*)    Calcium 10.7 (*)    Total Bilirubin 2.1 (*)    GFR calc non Af Amer 56 (*)    All other components within normal limits  TROPONIN I - Abnormal; Notable for the following components:   Troponin I 0.10 (*)    All other components within normal limits  MAGNESIUM    EKG  EKG Interpretation  Date/Time:  Sunday May 25 2017 12:44:35 EDT Ventricular Rate:  128 PR Interval:    QRS Duration: 132 QT Interval:  338 QTC Calculation: 493 R  Axis:   87 Text Interpretation:  Atrial flutter with variable A-V block with premature ventricular or aberrantly conducted complexes Right bundle branch block Septal infarct , age undetermined T wave abnormality, consider inferior ischemia No previous tracing Confirmed by Blanchie Dessert [phone]) on 05/25/2017 1:19:50 PM       Radiology Dg Chest Port 1 View  Result Date: 05/25/2017 CLINICAL DATA:  Palpitations, heart racing EXAM: PORTABLE CHEST 1 VIEW COMPARISON:  07/18/2016 FINDINGS: Left pacer remains in place, unchanged. Mild cardiomegaly. No confluent airspace opacities or effusions. No acute bony abnormality. IMPRESSION: Mild cardiomegaly.  No acute findings. Electronically Signed   By: Rolm Baptise M.D.   On: 05/25/2017 13:49    Procedures Procedures (including critical care time)  Medications Ordered in ED Medications  0.9 %  sodium chloride infusion (not administered)  diltiazem (CARDIZEM) 1 mg/mL load via infusion 10 mg (not administered)    And  diltiazem (CARDIZEM) 100 mg in dextrose 5 % 100 mL (1 mg/mL) infusion (not administered)     Initial Impression / Assessment and Plan / ED Course  I have reviewed the triage vital signs and the nursing notes.  Pertinent labs & imaging results that were available during my care of the patient were reviewed by me and considered in my medical decision making (see chart for details).     82 year old gentleman with a history of paroxysmal atrial fibrillation and prior pacemaker placement who has been taken off all rhythm controlling medications and anticoagulation approximately 1 year ago because he had had no further episodes presenting today with palpitations that started when he woke up this morning.  Patient denies any chest pain, new shortness of breath, near syncope or weakness.  He states that she just not getting better.  Patient tried to call his cardiologist but nobody answered the phone so he came here for further evaluation.   He denies any reason that he would be dehydrated he is not taking excessive caffeine.  He has had no recent change in his medications. On exam patient has tachycardia but is otherwise well-appearing.  Blood pressures are within normal limits.  Will give a dose of Cardizem.  CBC, CMP, magnesium, chest x-ray is pending.  3:09 PM After Cardizem bolus and drip heart rate has not significantly changed.  Patient does have a mild elevation in his troponin of 0.10 but normal CBC and CMP.  Patient's magnesium is within normal limits.  He still remained stable on exam.  Attempting to interrogate the pacemaker but will talk to his cardiologist.  3:34 PM Spoke with [facility] cardiologist who recommended stopping Cardizem as patient has not had any response and trying IV amiodarone.  He then stated patient could go home and start on an anticoagulant.  However discussed with the cardiologist that I would not be sending an 83 year old gentleman home with a heart rate of 130 and that we will contact him back if his heart rate does not improve.  CRITICAL CARE Performed by: Bejamin Hackbart Total critical care time: 30 minutes Critical care time was exclusive of separately billable procedures and treating other patients. Critical care was necessary to treat or prevent imminent or life-threatening deterioration. Critical care was time spent personally by me on the following activities: development of treatment plan with patient and/or surrogate as well as nursing, discussions with consultants, evaluation of patient's response to treatment, examination of patient, obtaining history from patient or surrogate, ordering and performing treatments and interventions, ordering and review of laboratory studies, ordering and review of radiographic studies, pulse oximetry and re-evaluation of patient's condition.   Final Clinical Impressions(s) / ED Diagnoses   Final diagnoses:  Atrial fibrillation with [facility])    ED  Discharge Orders    None       Blanchie Dessert, MD 05/25/17 1605

## 2017-05-25 NOTE — ED Provider Notes (Signed)
Received care of patient from Dr. Maryan Rued.  Please see her note for prior history, physical and care.  Briefly this is a 83 year old male who presented with concern for palpitations, was found to be in atrial flutter with an elevated rate.  His troponin is elevated likely secondary to demand.  Patient denied any shortness of breath or chest pain.  Diltiazem infusion was attempted, however patient without decrease in rate.  She had discussed with Dr. Burnis Kingfisher of cardiology at [facility], who recommended amiodarone.  Amiodarone given to patient, with conversion to atrial paced rhythm with improved rate.  Patient  asymptomatic.  Observed without development of return of symptoms or arrhythmia and O2 saturations noted to be normal.  Recommend follow up tomorrow per Cardiology recommendations. Gave rx for eliquis and amiodarone. Patient discharged in stable condition with understanding of reasons to return.    Gareth Morgan, MD 05/26/17 1123

## 2017-05-25 NOTE — ED Triage Notes (Signed)
Pt states he woke up with his "heart racing". Hx of afib and pacemaker. Denies chest pain, SOB.

## 2017-05-26 LAB — TSH: TSH: 1.461 u[IU]/mL (ref 0.350–4.500)

## 2017-07-08 DIAGNOSIS — H353211 Exudative age-related macular degeneration, right eye, with active choroidal neovascularization: Secondary | ICD-10-CM | POA: Insufficient documentation

## 2017-09-04 DIAGNOSIS — K648 Other hemorrhoids: Secondary | ICD-10-CM | POA: Insufficient documentation

## 2017-09-04 DIAGNOSIS — K625 Hemorrhage of anus and rectum: Secondary | ICD-10-CM | POA: Insufficient documentation

## 2017-09-08 DIAGNOSIS — D696 Thrombocytopenia, unspecified: Secondary | ICD-10-CM | POA: Insufficient documentation

## 2017-09-23 DIAGNOSIS — Z87891 Personal history of nicotine dependence: Secondary | ICD-10-CM | POA: Insufficient documentation

## 2018-01-26 DIAGNOSIS — E669 Obesity, unspecified: Secondary | ICD-10-CM | POA: Insufficient documentation

## 2018-01-26 DIAGNOSIS — K5909 Other constipation: Secondary | ICD-10-CM | POA: Insufficient documentation

## 2018-08-04 DIAGNOSIS — H40021 Open angle with borderline findings, high risk, right eye: Secondary | ICD-10-CM | POA: Insufficient documentation

## 2018-08-04 DIAGNOSIS — H401122 Primary open-angle glaucoma, left eye, moderate stage: Secondary | ICD-10-CM | POA: Insufficient documentation

## 2018-08-18 DIAGNOSIS — J449 Chronic obstructive pulmonary disease, unspecified: Secondary | ICD-10-CM | POA: Insufficient documentation

## 2018-08-18 DIAGNOSIS — Z85820 Personal history of malignant melanoma of skin: Secondary | ICD-10-CM | POA: Insufficient documentation

## 2018-09-15 DIAGNOSIS — R229 Localized swelling, mass and lump, unspecified: Secondary | ICD-10-CM | POA: Insufficient documentation

## 2019-09-30 DIAGNOSIS — J432 Centrilobular emphysema: Secondary | ICD-10-CM | POA: Insufficient documentation

## 2019-10-19 DIAGNOSIS — R42 Dizziness and giddiness: Secondary | ICD-10-CM | POA: Insufficient documentation

## 2019-12-21 DIAGNOSIS — R7989 Other specified abnormal findings of blood chemistry: Secondary | ICD-10-CM | POA: Insufficient documentation

## 2020-01-03 DIAGNOSIS — Z96652 Presence of left artificial knee joint: Secondary | ICD-10-CM | POA: Insufficient documentation

## 2020-01-06 DIAGNOSIS — R0602 Shortness of breath: Secondary | ICD-10-CM | POA: Insufficient documentation

## 2020-01-08 LAB — BASIC METABOLIC PANEL
BUN: 18 (ref 4–21)
CO2: 29 — AB (ref 13–22)
Chloride: 97 — AB (ref 99–108)
Creatinine: 1.1 (ref 0.6–1.3)
Glucose: 135
Potassium: 3.2 — AB (ref 3.4–5.3)
Sodium: 133 — AB (ref 137–147)

## 2020-01-08 LAB — COMPREHENSIVE METABOLIC PANEL: Calcium: 9.8 (ref 8.7–10.7)

## 2020-01-09 LAB — BASIC METABOLIC PANEL
BUN: 20 (ref 4–21)
CO2: 30 — AB (ref 13–22)
Chloride: 99 (ref 99–108)
Creatinine: 1.1 (ref 0.6–1.3)
Glucose: 111
Potassium: 3.6 (ref 3.4–5.3)
Sodium: 134 — AB (ref 137–147)

## 2020-01-10 LAB — BASIC METABOLIC PANEL
CO2: 29 — AB (ref 13–22)
Chloride: 99 (ref 99–108)
Creatinine: 1.1 (ref 0.6–1.3)
Potassium: 3.4 (ref 3.4–5.3)
Sodium: 132 — AB (ref 137–147)

## 2020-01-10 LAB — COMPREHENSIVE METABOLIC PANEL: Calcium: 9.8 (ref 8.7–10.7)

## 2020-01-10 NOTE — Progress Notes (Signed)
Provider:  Rexene Edison. Mariea Clonts, D.O., C.M.D. Location:  [place] Room Number: 108-P Place of Service:  SNF (31)  PCP: Lilian Coma., MD Patient Care Team: Lilian Coma., MD as PCP - General (Internal Medicine)  Extended Emergency Contact Information Primary Emergency Contact: Sinyard,Pat Address: [place], [place] [address] Johnnette Litter of [place] Phone: [phone] Mobile Phone: [phone] Relation: Spouse  Code Status: FULL CODE Goals of Care: Advanced Directive information Advanced Directives 01/12/2020  Does Patient Have a Medical Advance Directive? No  Would patient like information on creating a medical advance directive? No - Patient declined   Chief Complaint  Patient presents with  . New Admit To SNF    Knee replacement and subsequent CHF    HPI: Patient is a 85 y.o. male seen today for admission to [facility] 10/25 s/p hospitalization at [facility] with shortness of breath 3 days after his left total knee arthroplasty.  Reportedly he had been told to hold his lasix by his cardiologist. He became more sob and presented to the ED.  There his BNP was elevated, he had mild bilateral pleural effusions on CT, no PE, bilateral LE edema and sats 94% on 2L.  covid testing negative.  He also had some postop anemia.  He was diuresed with lasix 40mg  IV bid and then switched over to lasix 20mg  and kcl 31meq daily with plan for bmp in a week.  Of note, this lower dose appears to have been chosen after an episode of dizziness where his pacemaker could not record an arrhythmia and he was not orthostatic on 10/24.    He has an ice machine that has not been used b/c no one asked me for an order for it until I saw him today.    He also has a h/o OSA for which he's tried an oral appliance but is not on cpap, copd gold 2/emphysema, diverticulosis, fatty liver, gilbert's, macular degeneration, htn,  cardiomyopathy, glaucoma and afib among others.    When history reviewed, he admitted to 3 shots of bourbon per night and prior smoking.    Past Medical History:  Diagnosis Date  . Arthritis   . Atrial fibrillation ([place])   . BPH with obstruction/lower urinary tract symptoms   . Cardiomyopathy ([place])   . Dyslipidemia   . Emphysema of lung ([place])   . Gout   . Hyperlipidemia   . Hypertension   . Hypogonadism in male   . Lung disease   . Macular degeneration   . Obstructive sleep apnea   . Organic impotence   . Pacemaker   . Pacemaker   . Skin cancer   . Symptomatic bradycardia   . Wears hearing aid in both ears    Past Surgical History:  Procedure Laterality Date  . avastin injections     . cancerous lesion removal    . CARDIAC PACEMAKER PLACEMENT    . cervical fusion    . COLONOSCOPY    . cryoablation    . cryoablation    . EYE SURGERY    . JOINT REPLACEMENT    . NECK SURGERY    . NECK SURGERY    . PACEMAKER INSERTION    . REPLACEMENT TOTAL KNEE    . totoal knee replacement     . VASECTOMY      Social History   Socioeconomic History  .  Marital status: Married    Spouse name: Not on file  . Number of children: Not on file  . Years of education: Not on file  . Highest education level: Not on file  Occupational History  . Not on file  Tobacco Use  . Smoking status: Former Smoker    Packs/day: 2.50    Years: 20.00    Pack years: 50.00  . Smokeless tobacco: Never Used  Substance and Sexual Activity  . Alcohol use: Yes    Comment: 2-3 drinks daily  . Drug use: No  . Sexual activity: Not Currently  Other Topics Concern  . Not on file  Social History Narrative  . Not on file   Social Determinants of Health   Financial Resource Strain:   . Difficulty of Paying Living Expenses: Not on file  Food Insecurity:   . Worried About Charity fundraiser in the Last Year: Not on file  . Ran Out of Food in the Last Year: Not on file  Transportation Needs:   .  Lack of Transportation (Medical): Not on file  . Lack of Transportation (Non-Medical): Not on file  Physical Activity:   . Days of Exercise per Week: Not on file  . Minutes of Exercise per Session: Not on file  Stress:   . Feeling of Stress : Not on file  Social Connections:   . Frequency of Communication with Friends and Family: Not on file  . Frequency of Social Gatherings with Friends and Family: Not on file  . Attends Religious Services: Not on file  . Active Member of Clubs or Organizations: Not on file  . Attends Archivist Meetings: Not on file  . Marital Status: Not on file    reports that he has quit smoking. He has a 50.00 pack-year smoking history. He has never used smokeless tobacco. He reports current alcohol use. He reports that he does not use drugs.  Functional Status Survey:    Family History  Problem Relation Age of Onset  . Heart disease Mother   . Macular degeneration Mother   . Alzheimer's disease Father   . Macular degeneration Father   . Colon cancer Other     Health Maintenance  Topic Date Due  . TETANUS/TDAP  11/03/2023  . INFLUENZA VACCINE  Completed  . COVID-19 Vaccine  Completed  . PNA vac Low Risk Adult  Completed    Allergies  Allergen Reactions  . Bee Pollen Swelling  . Hctz [Hydrochlorothiazide]     Gout   . Lisinopril Cough    Outpatient Encounter Medications as of 01/11/2020  Medication Sig  . albuterol (VENTOLIN HFA) 108 (90 Base) MCG/ACT inhaler Inhale 2 puffs into the lungs every 6 (six) hours as needed for wheezing or shortness of breath.  Marland Kitchen apixaban (ELIQUIS) 5 MG TABS tablet Take 1 tablet (5 mg total) by mouth 2 (two) times daily.  Marland Kitchen atorvastatin (LIPITOR) 10 MG tablet Take 10 mg by mouth daily.  . bisacodyl (DULCOLAX) 10 MG suppository Place 10 mg rectally as needed for moderate constipation.  . budesonide-formoterol (SYMBICORT) 160-4.5 MCG/ACT inhaler Inhale 2 puffs into the lungs 2 (two) times daily.  .  cholecalciferol (VITAMIN D) 1000 UNITS tablet Take 1,000 Units by mouth in the morning and at bedtime.   Marland Kitchen diltiazem (DILTIAZEM CD) 180 MG 24 hr capsule Take 180 mg by mouth daily.  . dorzolamide-timolol (COSOPT) 22.3-6.8 MG/ML ophthalmic solution 1 drop 2 (two) times daily.  . finasteride (PROSCAR)  5 MG tablet Take 5 mg by mouth daily.  . furosemide (LASIX) 20 MG tablet Take 20 mg by mouth daily. Take extra dose of 20mg  as needed.  Marland Kitchen losartan (COZAAR) 100 MG tablet Take 100 mg by mouth daily.  . meclizine (ANTIVERT) 12.5 MG tablet Take 12.5 mg by mouth in the morning and at bedtime.   . Multiple Vitamins-Minerals (SYSTANE ICAPS AREDS2 PO) Take by mouth.  . Polyethylene Glycol 3350 (MIRALAX PO) Take 17 g by mouth.  Marland Kitchen POTASSIUM CHLORIDE ER PO Take 20 mEq by mouth. 1 tablet by mouth daily for Hypokalemia  . senna (SENOKOT) 8.6 MG TABS tablet Take 1 tablet by mouth.  . sotalol (BETAPACE) 80 MG tablet Take 80 mg by mouth 2 (two) times daily.  . tamsulosin (FLOMAX) 0.4 MG CAPS capsule Take 0.4 mg by mouth.  . vitamin B-12 (CYANOCOBALAMIN) 1000 MCG tablet Take 1,000 mcg by mouth daily.  . [DISCONTINUED] magnesium hydroxide (MILK OF MAGNESIA) 400 MG/5ML suspension Take by mouth daily as needed for mild constipation.  . [DISCONTINUED] TRAMADOL HCL PO Take 50 mg by mouth.  . [DISCONTINUED] amiodarone (PACERONE) 200 MG tablet Take 1 tablet (200 mg total) by mouth daily.  . [DISCONTINUED] amLODipine (NORVASC) 5 MG tablet Take 5 mg by mouth daily.  . [DISCONTINUED] atorvastatin (LIPITOR) 40 MG tablet Take 40 mg by mouth daily.  . [DISCONTINUED] fluticasone (VERAMYST) 27.5 MCG/SPRAY nasal spray Place 2 sprays into the nose daily.  . [DISCONTINUED] potassium chloride (K-DUR) 10 MEQ tablet Take 10 mEq by mouth daily.  . [DISCONTINUED] Sildenafil Citrate (VIAGRA PO) Take by mouth.  . [DISCONTINUED] testosterone enanthate (DELATESTRYL) 200 MG/ML injection Inject into the muscle every 14 (fourteen) days. For IM  use only   No facility-administered encounter medications on file as of 01/11/2020.    Review of Systems  Constitutional: Negative for chills and fever.  HENT: Negative for congestion and sore throat.   Eyes: Negative for blurred vision.  Respiratory: Positive for cough, sputum production and wheezing. Negative for shortness of breath.   Cardiovascular: Negative for chest pain, palpitations, orthopnea, leg swelling and PND.       Edema and sob improved  Gastrointestinal: Negative for abdominal pain, blood in stool, constipation and melena.  Genitourinary: Negative for dysuria.  Musculoskeletal: Positive for joint pain. Negative for falls.  Skin: Negative for itching and rash.  Neurological: Positive for weakness. Negative for dizziness and loss of consciousness.  Endo/Heme/Allergies: Bruises/bleeds easily.  Psychiatric/Behavioral: Negative for depression and memory loss. The patient is not nervous/anxious and does not have insomnia.     Vitals:   01/11/20 1159  BP: 113/66  Pulse: 75  Temp: (!) 97.3 F (36.3 C)  Weight: 238 lb 1.6 oz (108 kg)  Height: 6\' 2"  (1.88 m)   Body mass index is 30.57 kg/m. Physical Exam Vitals reviewed.  Constitutional:      General: He is not in acute distress.    Appearance: Normal appearance. He is obese. He is not toxic-appearing.  HENT:     Head: Normocephalic and atraumatic.     Right Ear: External ear normal.     Left Ear: External ear normal.     Nose: Nose normal.     Mouth/Throat:     Pharynx: Oropharynx is clear.  Eyes:     Extraocular Movements: Extraocular movements intact.     Conjunctiva/sclera: Conjunctivae normal.     Pupils: Pupils are equal, round, and reactive to light.  Cardiovascular:  Rate and Rhythm: Rhythm irregular.     Heart sounds: No murmur heard.      Comments: Pacemaker in place Pulmonary:     Effort: Pulmonary effort is normal.     Breath sounds: Normal breath sounds. No wheezing or rales.  Abdominal:      General: Bowel sounds are normal. There is no distension.     Palpations: Abdomen is soft.     Tenderness: There is no abdominal tenderness. There is no guarding or rebound.  Musculoskeletal:        General: Normal range of motion.     Cervical back: No tenderness.     Right lower leg: No edema.     Left lower leg: No edema.     Comments: Left TKA site w/o erythema, warmth or drainage, has considerable ecchymoses of entire left leg  Lymphadenopathy:     Cervical: No cervical adenopathy.  Skin:    General: Skin is warm and dry.  Neurological:     General: No focal deficit present.     Mental Status: He is alert and oriented to person, place, and time.     Cranial Nerves: No cranial nerve deficit.     Motor: No weakness.     Gait: Gait normal.  Psychiatric:        Mood and Affect: Mood normal.        Behavior: Behavior normal.        Thought Content: Thought content normal.        Judgment: Judgment normal.     Labs reviewed: Basic Metabolic Panel: Recent Labs    01/08/20 0000 01/09/20 0000 01/10/20 0000  NA 133* 134* 132*  K 3.2* 3.6 3.4  CL 97* 99 99  CO2 29* 30* 29*  BUN 18 20  --   CREATININE 1.1 1.1 1.1  CALCIUM 9.8  --  9.8   Liver Function Tests: No results for input(s): AST, ALT, ALKPHOS, BILITOT, PROT, ALBUMIN in the last 8760 hours. No results for input(s): LIPASE, AMYLASE in the last 8760 hours. No results for input(s): AMMONIA in the last 8760 hours. CBC: No results for input(s): WBC, NEUTROABS, HGB, HCT, MCV, PLT in the last 8760 hours. Cardiac Enzymes: No results for input(s): CKTOTAL, CKMB, CKMBINDEX, TROPONINI in the last 8760 hours. BNP: Invalid input(s): POCBNP No results found for: HGBA1C Lab Results  Component Value Date   TSH 1.461 05/25/2017   No results found for: VITAMINB12 No results found for: FOLATE No results found for: IRON, TIBC, FERRITIN  Imaging and Procedures obtained prior to SNF admission: DG Chest Port 1  View  Result Date: 05/25/2017 CLINICAL DATA:  Palpitations, heart racing EXAM: PORTABLE CHEST 1 VIEW COMPARISON:  07/18/2016 FINDINGS: Left pacer remains in place, unchanged. Mild cardiomegaly. No confluent airspace opacities or effusions. No acute bony abnormality. IMPRESSION: Mild cardiomegaly.  No acute findings. Electronically Signed   By: Rolm Baptise M.D.   On: 05/25/2017 13:49    Assessment/Plan 1. Pulmonary edema with congestive heart failure (HCC) -improved -to be on the facility twice a week weight protocol with parameters to notify [place] of weight gain -cont current diuretics and monitor edema, respirations, sats   2. Atrial fibrillation with RVR (HCC) -rate now controlled, cont sotalol, eliquis, cardizem  3. Total knee replacement status, left -recovering slower than with right but that was years ago and not complicated by chf exacerbation -start tramadol q6 instead of q8h prn as he's requested   4. Stage 2 moderate  COPD by GOLD classification ([place]) -cont home regimen with symbicort and prn albuterol  5. Cardiomyopathy, unspecified type ([place]) -cont lasix and monitor weights  6. Thrombocytopenia ([place]) -cont to monitor  7. OSA (obstructive sleep apnea) -not on tx at this time  8. Benign prostatic hyperplasia without lower urinary tract symptoms -cont flomax  9. Obesity (BMI 30-39.9) -remains, encouraged healthy diet and getting back on track with exercise after total knee therapy  10. Exudative age-related macular degeneration of right eye with active choroidal neovascularization ([place]) -per records, cont ophtho f/u for this and glaucoma  Family/ staff Communication: d/w his wife who was present and snf nurse  Labs/tests ordered:  Cbc, bmp at one week, ice machine orders  Willis Holquin L. Teofilo Lupinacci, D.O. [facility] [address],  [address] Cell Phone (Mon-Fri 8am-5pm):  [phone] On Call:  [phone] &  follow prompts after 5pm & weekends Office Phone:  [phone] Office Fax:  [phone]

## 2020-01-11 ENCOUNTER — Encounter: Payer: Self-pay | Admitting: Internal Medicine

## 2020-01-11 ENCOUNTER — Non-Acute Institutional Stay (SKILLED_NURSING_FACILITY): Payer: Medicare Other | Admitting: Internal Medicine

## 2020-01-11 DIAGNOSIS — I501 Left ventricular failure: Secondary | ICD-10-CM | POA: Diagnosis not present

## 2020-01-11 DIAGNOSIS — E669 Obesity, unspecified: Secondary | ICD-10-CM

## 2020-01-11 DIAGNOSIS — J449 Chronic obstructive pulmonary disease, unspecified: Secondary | ICD-10-CM | POA: Diagnosis not present

## 2020-01-11 DIAGNOSIS — H353211 Exudative age-related macular degeneration, right eye, with active choroidal neovascularization: Secondary | ICD-10-CM

## 2020-01-11 DIAGNOSIS — I4891 Unspecified atrial fibrillation: Secondary | ICD-10-CM | POA: Diagnosis not present

## 2020-01-11 DIAGNOSIS — G4733 Obstructive sleep apnea (adult) (pediatric): Secondary | ICD-10-CM

## 2020-01-11 DIAGNOSIS — I429 Cardiomyopathy, unspecified: Secondary | ICD-10-CM

## 2020-01-11 DIAGNOSIS — R0602 Shortness of breath: Secondary | ICD-10-CM

## 2020-01-11 DIAGNOSIS — J440 Chronic obstructive pulmonary disease with acute lower respiratory infection: Secondary | ICD-10-CM | POA: Insufficient documentation

## 2020-01-11 DIAGNOSIS — J209 Acute bronchitis, unspecified: Secondary | ICD-10-CM | POA: Insufficient documentation

## 2020-01-11 DIAGNOSIS — N400 Enlarged prostate without lower urinary tract symptoms: Secondary | ICD-10-CM

## 2020-01-11 DIAGNOSIS — Z96652 Presence of left artificial knee joint: Secondary | ICD-10-CM

## 2020-01-11 DIAGNOSIS — D696 Thrombocytopenia, unspecified: Secondary | ICD-10-CM

## 2020-01-12 ENCOUNTER — Non-Acute Institutional Stay (SKILLED_NURSING_FACILITY): Payer: Medicare Other | Admitting: Family

## 2020-01-12 ENCOUNTER — Encounter: Payer: Self-pay | Admitting: Family

## 2020-01-12 ENCOUNTER — Telehealth: Payer: Self-pay | Admitting: Family

## 2020-01-12 DIAGNOSIS — R0902 Hypoxemia: Secondary | ICD-10-CM

## 2020-01-12 DIAGNOSIS — Z96652 Presence of left artificial knee joint: Secondary | ICD-10-CM | POA: Diagnosis not present

## 2020-01-12 DIAGNOSIS — R600 Localized edema: Secondary | ICD-10-CM | POA: Diagnosis not present

## 2020-01-12 DIAGNOSIS — L03116 Cellulitis of left lower limb: Secondary | ICD-10-CM

## 2020-01-12 LAB — CBC AND DIFFERENTIAL
HCT: 30 — AB (ref 41–53)
Hemoglobin: 10.4 — AB (ref 13.5–17.5)
Platelets: 262 (ref 150–399)
WBC: 11.1

## 2020-01-12 LAB — CBC: RBC: 3.15 — AB (ref 3.87–5.11)

## 2020-01-12 MED ORDER — HYDROCODONE-ACETAMINOPHEN 5-325 MG PO TABS: 1.0000 | ORAL_TABLET | Freq: Four times a day (QID) | ORAL | 0 refills | Status: AC | PRN

## 2020-01-12 NOTE — Telephone Encounter (Signed)
Facility Nurse called on call provider states chest X-ray results indicates bronchitis.WBC elevated 11 will continue on doxycycline order earlier for left knee surgical incision cellulitis. Orders given to add Duoneb every 6 hrs for cough,shortness of breath or wheezing x 14 days.

## 2020-01-12 NOTE — Progress Notes (Signed)
Location:    [facility]   [facility] Room Number: 108-P Place of Service:  SNF (31) Provider:  Marlowe Sax, NP    Patient Care Team: Lilian Coma., MD as PCP - General (Internal Medicine)  Extended Emergency Contact Information Primary Emergency Contact: Tonne,Pat Address: [place], [place] [address] Johnnette Litter of [place] Phone: [phone] Mobile Phone: [phone] Relation: Spouse  Code Status:  Full Code  Goals of care: Advanced Directive information Advanced Directives 01/12/2020  Does Patient Have a Medical Advance Directive? No  Would patient like information on creating a medical advance directive? No - Patient declined     Chief Complaint  Patient presents with  . Acute Visit    Left Leg Increased Pain and Swelling.    HPI:  Pt is a 85 y.o. male seen today for an acute visit for evaluation of increased left leg pain and swelling.He is seen in his room in bed with wife at bedside.wife states patient has been very restless due to left knee pain.Has had Tramadol for pain without any relief.has also applied ice pack but not helping either.Has noticed worsening redness on incision site.Dressing with small amounts of drainage.Wound care Nurse notified and dressing changed. Also complained of shortness of breath Nurse reports oxygen saturation was 88 % on room air.He states oxygen saturation usually drops sometimes at time.He wears oxygen as needed for COPD.oxygen 2 liters applied by facility Nurse with much improvement of oxygen saturation. Patient's wife states had 4 lbs weight gain overnight.extra dose of Furosemide already given by Nurse.  He denies any fever or chills.      Past Medical History:  Diagnosis Date  . Arthritis   . Atrial fibrillation ([place])   . BPH with obstruction/lower urinary tract symptoms   . Cardiomyopathy ([place])   . Dyslipidemia   . Emphysema of lung ([place])   . Gout   . Hyperlipidemia    . Hypertension   . Hypogonadism in male   . Lung disease   . Macular degeneration   . Obstructive sleep apnea   . Organic impotence   . Pacemaker   . Pacemaker   . Skin cancer   . Symptomatic bradycardia   . Wears hearing aid in both ears    Past Surgical History:  Procedure Laterality Date  . avastin injections     . cancerous lesion removal    . CARDIAC PACEMAKER PLACEMENT    . cervical fusion    . COLONOSCOPY    . cryoablation    . cryoablation    . EYE SURGERY    . JOINT REPLACEMENT    . NECK SURGERY    . NECK SURGERY    . PACEMAKER INSERTION    . REPLACEMENT TOTAL KNEE    . totoal knee replacement     . VASECTOMY      Allergies  Allergen Reactions  . Bee Pollen Swelling  . Hctz [Hydrochlorothiazide]     Gout   . Lisinopril Cough    Allergies as of 01/12/2020      Reactions   Bee Pollen Swelling   Hctz [hydrochlorothiazide]    Gout   Lisinopril Cough      Medication List       Accurate as of January 12, 2020  3:10 PM. If you have any questions, ask your nurse or doctor.        albuterol 108 (  90 Base) MCG/ACT inhaler Commonly known as: VENTOLIN HFA Inhale 2 puffs into the lungs every 6 (six) hours as needed for wheezing or shortness of breath.   apixaban 5 MG Tabs tablet Commonly known as: Eliquis Take 1 tablet (5 mg total) by mouth 2 (two) times daily.   atorvastatin 10 MG tablet Commonly known as: LIPITOR Take 10 mg by mouth daily.   bisacodyl 10 MG suppository Commonly known as: DULCOLAX Place 10 mg rectally as needed for moderate constipation.   budesonide-formoterol 160-4.5 MCG/ACT inhaler Commonly known as: SYMBICORT Inhale 2 puffs into the lungs 2 (two) times daily.   cholecalciferol 1000 units tablet Commonly known as: VITAMIN D Take 1,000 Units by mouth in the morning and at bedtime.   dilTIAZem CD 180 MG 24 hr capsule Generic drug: diltiazem Take 180 mg by mouth daily.   dorzolamide-timolol 22.3-6.8 MG/ML ophthalmic  solution Commonly known as: COSOPT 1 drop 2 (two) times daily.   EPINEPHrine 0.3 mg/0.3 mL Soaj injection Commonly known as: EPI-PEN Inject 0.3 mg into the muscle as needed for anaphylaxis.   finasteride 5 MG tablet Commonly known as: PROSCAR Take 5 mg by mouth daily.   furosemide 20 MG tablet Commonly known as: LASIX Take 20 mg by mouth daily. Take extra dose of 20mg  as needed.   losartan 100 MG tablet Commonly known as: COZAAR Take 100 mg by mouth daily.   meclizine 12.5 MG tablet Commonly known as: ANTIVERT Take 12.5 mg by mouth in the morning and at bedtime.   MILK OF MAGNESIA PO Take 30 mLs by mouth as needed.   MIRALAX PO Take 17 g by mouth.   POTASSIUM CHLORIDE ER PO Take 20 mEq by mouth. 1 tablet by mouth daily for Hypokalemia   RA SALINE ENEMA RE Place rectally as needed.   senna 8.6 MG Tabs tablet Commonly known as: SENOKOT Take 1 tablet by mouth.   sotalol 80 MG tablet Commonly known as: BETAPACE Take 80 mg by mouth 2 (two) times daily.   SYSTANE ICAPS AREDS2 PO Take by mouth.   tamsulosin 0.4 MG Caps capsule Commonly known as: FLOMAX Take 0.4 mg by mouth.   TRAMADOL HCL PO Take 50 mg by mouth.   vitamin B-12 1000 MCG tablet Commonly known as: CYANOCOBALAMIN Take 1,000 mcg by mouth daily.       Review of Systems  Constitutional: Negative for appetite change, chills, fatigue and fever.  HENT: Positive for hearing loss. Negative for congestion, rhinorrhea, sinus pressure, sinus pain, sneezing and sore throat.   Eyes: Negative for discharge, redness and itching.  Respiratory: Negative for cough, chest tightness and wheezing.        Shortness of breath resolved with application of oxygen   Cardiovascular: Positive for leg swelling. Negative for chest pain and palpitations.  Gastrointestinal: Negative for abdominal distention, abdominal pain, constipation, diarrhea, nausea and vomiting.  Genitourinary: Negative for difficulty urinating,  dysuria, flank pain, frequency and urgency.  Musculoskeletal: Positive for arthralgias and gait problem. Negative for joint swelling and myalgias.  Skin: Positive for color change. Negative for pallor and rash.       Left knee surgical incision     Immunization History  Administered Date(s) Administered  . 19-influenza Whole 12/26/2008  . Influenza, High Dose Seasonal PF 11/28/2014, 12/12/2017, 12/21/2019  . Influenza,inj,Quad PF,6+ Mos 12/10/2016, 12/25/2018  . Influenza-Unspecified 12/26/2008, 01/17/2012, 01/04/2013, 02/22/2014, 11/28/2014, 01/19/2016, 12/16/2016, 12/16/2017  . Moderna SARS-COVID-2 Vaccination 03/21/2019, 04/21/2019  . Pneumococcal Conjugate-13 03/18/2013, 08/24/2014  .  Pneumococcal Polysaccharide-23 06/11/2007, 08/21/2012  . Td 04/11/1995, 10/02/2004  . Tdap 10/02/2004, 03/18/2013, 11/02/2013   Pertinent  Health Maintenance Due  Topic Date Due  . INFLUENZA VACCINE  Completed  . PNA vac Low Risk Adult  Completed   No flowsheet data found. Functional Status Survey:    Vitals:   01/12/20 1500  BP: 124/73  Pulse: 72  Resp: 18  Temp: 98 F (36.7 C)  Weight: 238 lb (108 kg)  Height: 6\' 2"  (1.88 m)   Body mass index is 30.56 kg/m. Physical Exam Vitals reviewed.  Constitutional:      Appearance: He is not ill-appearing, toxic-appearing or diaphoretic.     Comments: Restless in bed keep sitting,lying and turning in bed.Unable to get comfortable position.  HENT:     Head: Normocephalic.     Mouth/Throat:     Mouth: Mucous membranes are moist.     Pharynx: Oropharynx is clear. No oropharyngeal exudate or posterior oropharyngeal erythema.  Eyes:     General: No scleral icterus.       Right eye: No discharge.        Left eye: No discharge.     Extraocular Movements: Extraocular movements intact.     Conjunctiva/sclera: Conjunctivae normal.     Pupils: Pupils are equal, round, and reactive to light.  Cardiovascular:     Rate and Rhythm: Normal rate and  regular rhythm.     Pulses: Normal pulses.     Heart sounds: Normal heart sounds. No murmur heard.  No friction rub. No gallop.      Comments: Left calf muscle tender to palpation.  Pulmonary:     Effort: Pulmonary effort is normal. No respiratory distress.     Breath sounds: No wheezing, rhonchi or rales.     Comments: diminished breath sounds on bilateral bases  Chest:     Chest wall: No tenderness.  Abdominal:     General: Bowel sounds are normal. There is no distension.     Palpations: Abdomen is soft. There is no mass.     Tenderness: There is no abdominal tenderness. There is no right CVA tenderness, left CVA tenderness, guarding or rebound.  Musculoskeletal:     Cervical back: Normal range of motion. No rigidity or tenderness.     Right knee: Normal.     Left knee: Erythema present. No effusion. Tenderness present.     Right lower leg: No edema.     Left lower leg: Edema present.  Lymphadenopathy:     Cervical: No cervical adenopathy.  Skin:    General: Skin is warm.     Coloration: Skin is not jaundiced or pale.     Findings: No bruising.     Comments: Left knee surgical incision old dressing with small amounts of old seros drainage from mid lateral incision on  3 O'clock position.Erythema noted slight warm to touch and tender.  Purple bruise on inner thigh,calf muscle and lower leg.  Neurological:     Mental Status: He is alert and oriented to person, place, and time.     Cranial Nerves: No cranial nerve deficit.     Sensory: No sensory deficit.     Motor: No weakness.     Coordination: Coordination normal.     Gait: Gait abnormal.  Psychiatric:        Mood and Affect: Mood normal.        Speech: Speech normal.     Comments: Restless     Labs  reviewed: Recent Labs    01/08/20 0000 01/09/20 0000 01/10/20 0000  NA 133* 134* 132*  K 3.2* 3.6 3.4  CL 97* 99 99  CO2 29* 30* 29*  BUN 18 20  --   CREATININE 1.1 1.1 1.1  CALCIUM 9.8  --  9.8    Lab Results   Component Value Date   TSH 1.461 05/25/2017    Significant Diagnostic Results in last 30 days:  No results found.  Assessment/Plan 1. Hypoxia Reported shortness of breath while in bed.oxygen saturation 88 % on room.oxygen 2 liters via nasal cannula applied with much improvement.  - will obtain portable Chest X-ray 2 views to rule out acute abnormalities. - BMP stat   2. Total knee replacement status, left Status post knee arthroplasty 01/03/2020.Pain uncontrolled patient restless in bed despite use of Tramadol.surgical incision with erythema,slight warm and tender to touch. - facility Nurse to obtain a sooner appointment with Orthopedic Dr. Becky Sax for evaluation. - continue with ice pack machine  - discontinue tramadol 50 mg tablet  - start on Hydrocodone- Acetaminophen 5 -325 mg tablet take one to two tablets every 6 hrs as needed for pain.one tablet for moderate pain and two tablets for severe pain.  - on Eliquis     3. Cellulitis of left knee Status post left TKA incision site with erythema,slightly warm and tender to touch. - CBC/diff stat  - start on doxycycline 100 mg tablet one by mouth twice daily x 7 days along with Florastor 250 mg capsule one by mouth twice daily x 10 days.   4. Leg edema, left Worsening leg edema with calf muscle tenderness to palpation. - will obtain left leg duplex venous study.  Family/ staff Communication: Reviewed plan of care with patient and wife.   Labs/tests ordered:  -  left leg duplex venous study - Chest X-ray 2 views to rule out acute abnormalities. - CBC/diff ,BMP stat   Addendum:  Left leg Duplex studies negative for DVT results discussed with patient and wife.

## 2020-01-17 LAB — CBC: RBC: 2.94 — AB (ref 3.87–5.11)

## 2020-01-17 LAB — BASIC METABOLIC PANEL
BUN: 16 (ref 4–21)
CO2: 25 — AB (ref 13–22)
Chloride: 101 (ref 99–108)
Creatinine: 1.1 (ref 0.6–1.3)
Glucose: 110
Potassium: 4.7 (ref 3.4–5.3)
Sodium: 136 — AB (ref 137–147)

## 2020-01-17 LAB — CBC AND DIFFERENTIAL
HCT: 29 — AB (ref 41–53)
Hemoglobin: 9.8 — AB (ref 13.5–17.5)
Platelets: 310 (ref 150–399)
WBC: 8.5

## 2020-01-17 LAB — COMPREHENSIVE METABOLIC PANEL
Calcium: 10.6 (ref 8.7–10.7)
GFR calc Af Amer: 75.56
GFR calc non Af Amer: 65.19

## 2020-01-18 ENCOUNTER — Encounter: Payer: Self-pay | Admitting: Internal Medicine

## 2020-01-18 ENCOUNTER — Non-Acute Institutional Stay (SKILLED_NURSING_FACILITY): Payer: Medicare Other | Admitting: Internal Medicine

## 2020-01-18 DIAGNOSIS — J440 Chronic obstructive pulmonary disease with acute lower respiratory infection: Secondary | ICD-10-CM

## 2020-01-18 DIAGNOSIS — J209 Acute bronchitis, unspecified: Secondary | ICD-10-CM

## 2020-01-18 DIAGNOSIS — Z96652 Presence of left artificial knee joint: Secondary | ICD-10-CM

## 2020-01-18 DIAGNOSIS — J432 Centrilobular emphysema: Secondary | ICD-10-CM | POA: Diagnosis not present

## 2020-01-18 DIAGNOSIS — R0902 Hypoxemia: Secondary | ICD-10-CM | POA: Diagnosis not present

## 2020-01-18 DIAGNOSIS — I501 Left ventricular failure: Secondary | ICD-10-CM | POA: Diagnosis not present

## 2020-01-18 DIAGNOSIS — L03116 Cellulitis of left lower limb: Secondary | ICD-10-CM

## 2020-01-18 NOTE — Progress Notes (Signed)
Location:  [facility] Room Number: 993Z Place of Service:  SNF [id number]) Provider:  Zeki Bedrosian L. Mariea Clonts, D.O., C.M.D.  Lilian Coma., MD  Patient Care Team: Lilian Coma., MD as PCP - General (Internal Medicine)  Extended Emergency Contact Information Primary Emergency Contact: Recendez,Pat Address: [place], [place] [address] Johnnette Litter of [place] Phone: [phone] Mobile Phone: [phone] Relation: Spouse  Code Status:  FULL CODE Goals of care: Advanced Directive information Advanced Directives 01/18/2020  Does Patient Have a Medical Advance Directive? No  Would patient like information on creating a medical advance directive? No - Patient declined     Chief Complaint  Patient presents with  . Acute Visit    HPI:  Pt is a 85 y.o. male seen today for an acute visit for concerns about respiratory status and possible need to followup with pulmonary.  He is here for rehab s/p hospitalization for chf just after his left total knee arthroplasty.  He'd been advised to hold his diuretics perioperatively and then developed volume overload.  He was diuresed at the hospital, but continues to require oxygen to maintain his sats though no attempt has been made to wean the oxygen at this point.  He denies any difference in dyspnea vs before he had his surgery.  He has COPD at baseline, but has never required oxygen before.  He has not been getting his nebs regularly by his and his wife's report.  He has not been wheezing either.    He's been to ortho and there are concerns about infection.  He's been treated for cellulitis of the left leg and still finishing his course of doxy thru 11/6.  The knee remains red and swollen but improved per he and his wife.  They have not really been using the ice machine either after I provided the orders--must rely on someone getting them ice from somewhere to operate it.   He was requiring the norco  for pain which was 1-2 q6h prn instead of tramadol due to severity of pain.  I was asked to see him by his wife due to his breathing concerns b/c orthopedics was worried that he was still on oxygen.  Past Medical History:  Diagnosis Date  . Arthritis   . Atrial fibrillation ([place])   . BPH with obstruction/lower urinary tract symptoms   . Cardiomyopathy ([place])   . Dyslipidemia   . Emphysema of lung ([place])   . Gout   . Hyperlipidemia   . Hypertension   . Hypogonadism in male   . Lung disease   . Macular degeneration   . Obstructive sleep apnea   . Organic impotence   . Pacemaker   . Pacemaker   . Skin cancer   . Symptomatic bradycardia   . Wears hearing aid in both ears    Past Surgical History:  Procedure Laterality Date  . avastin injections     . cancerous lesion removal    . CARDIAC PACEMAKER PLACEMENT    . cervical fusion    . COLONOSCOPY    . cryoablation    . cryoablation    . EYE SURGERY    . JOINT REPLACEMENT    . NECK SURGERY    . NECK SURGERY    . PACEMAKER INSERTION    . REPLACEMENT TOTAL KNEE    . totoal knee replacement     . VASECTOMY  Allergies  Allergen Reactions  . Bee Pollen Swelling  . Hctz [Hydrochlorothiazide]     Gout   . Lisinopril Cough    Outpatient Encounter Medications as of 01/18/2020  Medication Sig  . albuterol (VENTOLIN HFA) 108 (90 Base) MCG/ACT inhaler Inhale 2 puffs into the lungs every 6 (six) hours as needed for wheezing or shortness of breath.  Marland Kitchen apixaban (ELIQUIS) 5 MG TABS tablet Take 1 tablet (5 mg total) by mouth 2 (two) times daily.  Marland Kitchen atorvastatin (LIPITOR) 10 MG tablet Take 10 mg by mouth daily.  . bisacodyl (DULCOLAX) 10 MG suppository Place 10 mg rectally as needed for moderate constipation.  . budesonide-formoterol (SYMBICORT) 160-4.5 MCG/ACT inhaler Inhale 2 puffs into the lungs 2 (two) times daily.  . cholecalciferol (VITAMIN D) 1000 UNITS tablet Take 1,000 Units by mouth in the morning and at bedtime.   Marland Kitchen  diltiazem (DILTIAZEM CD) 180 MG 24 hr capsule Take 180 mg by mouth daily.  . dorzolamide-timolol (COSOPT) 22.3-6.8 MG/ML ophthalmic solution 1 drop 2 (two) times daily.  Marland Kitchen DOXYCYCLINE PO Take 50 mg by mouth.  . finasteride (PROSCAR) 5 MG tablet Take 5 mg by mouth daily.  . furosemide (LASIX) 20 MG tablet Take 20 mg by mouth daily. Take extra dose of 20mg  as needed.  Marland Kitchen HYDROcodone-acetaminophen (NORCO/VICODIN) 5-325 MG tablet Take 1-2 tablets by mouth every 6 (six) hours as needed for up to 7 days for moderate pain.  Marland Kitchen losartan (COZAAR) 100 MG tablet Take 100 mg by mouth daily.  . Magnesium Hydroxide (MILK OF MAGNESIA PO) Take 30 mLs by mouth as needed.  . meclizine (ANTIVERT) 12.5 MG tablet Take 12.5 mg by mouth in the morning and at bedtime.   . Multiple Vitamins-Minerals (SYSTANE ICAPS AREDS2 PO) Take by mouth.  . polyethylene glycol (MIRALAX / GLYCOLAX) 17 g packet Take 17 g by mouth daily.  Marland Kitchen POTASSIUM CHLORIDE ER PO Take 20 mEq by mouth. 1 tablet by mouth daily for Hypokalemia  . senna (SENOKOT) 8.6 MG TABS tablet Take 1 tablet by mouth.  . simethicone (MYLICON) 80 MG chewable tablet Chew 80 mg by mouth.  . Sodium Phosphates (RA SALINE ENEMA RE) Place rectally as needed.  . sotalol (BETAPACE) 80 MG tablet Take 80 mg by mouth 2 (two) times daily.  . tamsulosin (FLOMAX) 0.4 MG CAPS capsule Take 0.4 mg by mouth.  . vitamin B-12 (CYANOCOBALAMIN) 1000 MCG tablet Take 1,000 mcg by mouth daily.  Marland Kitchen EPINEPHrine 0.3 mg/0.3 mL IJ SOAJ injection Inject 0.3 mg into the muscle as needed for anaphylaxis.  . [DISCONTINUED] Polyethylene Glycol 3350 (MIRALAX PO) Take 17 g by mouth.   No facility-administered encounter medications on file as of 01/18/2020.    Review of Systems  Constitutional: Negative for chills, fever and malaise/fatigue.  HENT: Positive for hearing loss. Negative for congestion and sore throat.   Eyes: Negative for blurred vision.  Respiratory: Positive for shortness of breath.  Negative for cough, sputum production and wheezing.        Baseline, but had sats drop 3 days ago with therapy  Cardiovascular: Positive for leg swelling. Negative for chest pain, palpitations, orthopnea and PND.       Left leg swollen  Gastrointestinal: Negative for abdominal pain, blood in stool, constipation, diarrhea and melena.       Bowels moving now  Genitourinary: Negative for dysuria.  Musculoskeletal: Positive for joint pain. Negative for falls.  Skin:       Erythema, warmth, swelling of  left knee but improved  Neurological: Negative for dizziness and loss of consciousness.  Endo/Heme/Allergies: Bruises/bleeds easily.  Psychiatric/Behavioral: Negative for depression and memory loss. The patient is not nervous/anxious and does not have insomnia.     Immunization History  Administered Date(s) Administered  . 19-influenza Whole 12/26/2008  . Influenza, High Dose Seasonal PF 11/28/2014, 12/12/2017, 12/21/2019  . Influenza,inj,Quad PF,6+ Mos 12/10/2016, 12/25/2018  . Influenza-Unspecified 12/26/2008, 01/17/2012, 01/04/2013, 02/22/2014, 11/28/2014, 01/19/2016, 12/16/2016, 12/16/2017  . Moderna SARS-COVID-2 Vaccination 03/21/2019, 04/21/2019  . Pneumococcal Conjugate-13 03/18/2013, 08/24/2014  . Pneumococcal Polysaccharide-23 06/11/2007, 08/21/2012  . Td 04/11/1995, 10/02/2004  . Tdap 10/02/2004, 03/18/2013, 11/02/2013   Pertinent  Health Maintenance Due  Topic Date Due  . INFLUENZA VACCINE  Completed  . PNA vac Low Risk Adult  Completed   No flowsheet data found. Functional Status Survey:    Vitals:   01/18/20 1545  BP: 105/61  Pulse: 77  Temp: (!) 96.8 F (36 C)  Weight: 238 lb (108 kg)  Height: 6\' 2"  (1.88 m)   Body mass index is 30.56 kg/m. Physical Exam Vitals reviewed.  Constitutional:      General: He is not in acute distress.    Appearance: Normal appearance. He is obese. He is not toxic-appearing.  HENT:     Head: Normocephalic and atraumatic.  Neck:      Comments: No JVD Cardiovascular:     Rate and Rhythm: Normal rate and regular rhythm.     Heart sounds: No murmur heard.   Pulmonary:     Breath sounds: No rhonchi or rales.     Comments: Prolonged and diminished expiration, but seems to be CTA w/o rales, wearing 2L via [place] Abdominal:     General: Bowel sounds are normal.  Musculoskeletal:        General: Swelling and tenderness present. Normal range of motion.     Right lower leg: No edema.     Left lower leg: Edema present.  Skin:    General: Skin is warm and dry.     Comments: Left entire leg edematous; incision site with one small area of bleeding centrally on dressing; erythema surrounding plus some effusion; tender and unable to fully extend  Neurological:     General: No focal deficit present.     Mental Status: He is alert and oriented to person, place, and time.     Comments: Seems to have some memory challenges right now on pain medications  Psychiatric:        Mood and Affect: Mood normal.        Behavior: Behavior normal.     Labs reviewed: Recent Labs    01/08/20 0000 01/09/20 0000 01/10/20 0000  NA 133* 134* 132*  K 3.2* 3.6 3.4  CL 97* 99 99  CO2 29* 30* 29*  BUN 18 20  --   CREATININE 1.1 1.1 1.1  CALCIUM 9.8  --  9.8   No results for input(s): AST, ALT, ALKPHOS, BILITOT, PROT, ALBUMIN in the last 8760 hours. No results for input(s): WBC, NEUTROABS, HGB, HCT, MCV, PLT in the last 8760 hours. Lab Results  Component Value Date   TSH 1.461 05/25/2017   No results found for: HGBA1C No results found for: CHOL, HDL, LDLCALC, LDLDIRECT, TRIG, CHOLHDL  Significant Diagnostic Results in last 30 days:  No results found.  Assessment/Plan 1. Hypoxia -need to attempt to wean O2 which had been started for pulmonary edema and after lasix was held and acute bronchitis -back on  diuretics and lungs sound clear  2. Acute bronchitis with COPD ([place]) -resolved, doxy covered this and his left knee  cellulitis  3. Pulmonary edema with congestive heart failure ([place]) -lungs clear on exam and does not appear volume overloaded, only swelling is in left leg  4. Centrilobular emphysema ([facility]) -cont nebs, but give regularly while awake next few days, also continue his regular inhalers  -try to wean O2 -if unable to wean O2, return to pulmonary for further eval  5. Total knee replacement status, left -continue PT, OT -f/u with ortho again -use the ice as ordered -he's eager to d/c home for further care  6. Cellulitis of left knee -completing doxy, some improvement, but ortho may need to open and drain per pt and his wife -they opted to see if there was improvement over the next week  Family/ staff Communication: d/w his wife and SNF nurse  Labs/tests ordered:  No new added today  Jefferey Lippmann L. Mashal Slavick, D.O. [facility] [address], [place] [address] Cell Phone (Mon-Fri 8am-5pm):  [phone] On Call:  [phone] & follow prompts after 5pm & weekends Office Phone:  [phone] Office Fax:  [phone]

## 2020-01-20 ENCOUNTER — Encounter: Payer: Self-pay | Admitting: Family

## 2020-01-20 ENCOUNTER — Non-Acute Institutional Stay (SKILLED_NURSING_FACILITY): Payer: Medicare Other | Admitting: Family

## 2020-01-20 DIAGNOSIS — R2681 Unsteadiness on feet: Secondary | ICD-10-CM

## 2020-01-20 DIAGNOSIS — I480 Paroxysmal atrial fibrillation: Secondary | ICD-10-CM | POA: Diagnosis not present

## 2020-01-20 DIAGNOSIS — I501 Left ventricular failure: Secondary | ICD-10-CM

## 2020-01-20 DIAGNOSIS — N400 Enlarged prostate without lower urinary tract symptoms: Secondary | ICD-10-CM

## 2020-01-20 DIAGNOSIS — Z96652 Presence of left artificial knee joint: Secondary | ICD-10-CM | POA: Diagnosis not present

## 2020-01-20 DIAGNOSIS — J449 Chronic obstructive pulmonary disease, unspecified: Secondary | ICD-10-CM

## 2020-01-20 DIAGNOSIS — I10 Essential (primary) hypertension: Secondary | ICD-10-CM

## 2020-01-20 DIAGNOSIS — R42 Dizziness and giddiness: Secondary | ICD-10-CM

## 2020-01-20 DIAGNOSIS — E785 Hyperlipidemia, unspecified: Secondary | ICD-10-CM

## 2020-01-20 DIAGNOSIS — G4733 Obstructive sleep apnea (adult) (pediatric): Secondary | ICD-10-CM

## 2020-01-20 MED ORDER — APIXABAN 5 MG PO TABS: 5.0000 mg | ORAL_TABLET | Freq: Two times a day (BID) | ORAL | 0 refills | Status: AC

## 2020-01-20 MED ORDER — SOTALOL HCL 80 MG PO TABS: 80.0000 mg | ORAL_TABLET | Freq: Two times a day (BID) | ORAL | 0 refills | Status: AC

## 2020-01-20 MED ORDER — TAMSULOSIN HCL 0.4 MG PO CAPS: 0.4000 mg | ORAL_CAPSULE | Freq: Every day | ORAL | 0 refills | Status: AC

## 2020-01-20 MED ORDER — ALBUTEROL SULFATE HFA 108 (90 BASE) MCG/ACT IN AERS: 2.0000 | INHALATION_SPRAY | Freq: Four times a day (QID) | RESPIRATORY_TRACT | 0 refills | Status: DC | PRN

## 2020-01-20 MED ORDER — HYDROCODONE-ACETAMINOPHEN 5-325 MG PO TABS: 1.0000 | ORAL_TABLET | Freq: Four times a day (QID) | ORAL | 0 refills | Status: DC | PRN

## 2020-01-20 MED ORDER — DILTIAZEM HCL ER COATED BEADS 180 MG PO CP24: 180.0000 mg | ORAL_CAPSULE | Freq: Every day | ORAL | 0 refills | Status: AC

## 2020-01-20 MED ORDER — MECLIZINE HCL 12.5 MG PO TABS: 12.5000 mg | ORAL_TABLET | Freq: Two times a day (BID) | ORAL | 0 refills | Status: AC

## 2020-01-20 MED ORDER — ATORVASTATIN CALCIUM 10 MG PO TABS: 10.0000 mg | ORAL_TABLET | Freq: Every day | ORAL | 0 refills | Status: AC

## 2020-01-20 MED ORDER — BUDESONIDE-FORMOTEROL FUMARATE 160-4.5 MCG/ACT IN AERO: 2.0000 | INHALATION_SPRAY | Freq: Two times a day (BID) | RESPIRATORY_TRACT | 0 refills | Status: DC

## 2020-01-20 MED ORDER — FUROSEMIDE 20 MG PO TABS: 20.0000 mg | ORAL_TABLET | Freq: Every day | ORAL | 0 refills | Status: AC

## 2020-01-20 MED ORDER — LOSARTAN POTASSIUM 100 MG PO TABS: 100.0000 mg | ORAL_TABLET | Freq: Every day | ORAL | 0 refills | Status: AC

## 2020-01-20 MED ORDER — FINASTERIDE 5 MG PO TABS: 5.0000 mg | ORAL_TABLET | Freq: Every day | ORAL | 0 refills | Status: AC

## 2020-01-20 MED ORDER — IPRATROPIUM-ALBUTEROL 0.5-2.5 (3) MG/3ML IN SOLN: 3.0000 mL | RESPIRATORY_TRACT | 0 refills | Status: AC | PRN

## 2020-01-20 NOTE — Progress Notes (Signed)
Location:  [place] Room Number: 108-P Place of Service:  SNF [phone])  Provider: Marlowe Sax FNP-C   PCP: Lilian Coma., MD Patient Care Team: Lilian Coma., MD as PCP - General (Internal Medicine)  Extended Emergency Contact Information Primary Emergency Contact: Bamford,Pat Address: [place], [place] [address] [address] Phone: [phone] Mobile Phone: [phone] Relation: Spouse  Code Status: Full Code  Goals of care:  Advanced Directive information Advanced Directives 01/20/2020  Does Patient Have a Medical Advance Directive? No  Would patient like information on creating a medical advance directive? No - Patient declined     Allergies  Allergen Reactions  . Bee Pollen Swelling  . Hctz [Hydrochlorothiazide]     Gout   . Lisinopril Cough    Chief Complaint  Patient presents with  . Discharge Note    Discharge from SNF to home with wife 01/21/2020.    HPI:  85 y.o. male seen today at [facility] for discharge home with wife on 01/21/2020.He was here for short term rehabilitation for post hospital admission at [facility] point from 01/06/2020 - 01/10/2020 for shortness of breath post left knee total arthroplasty.CT scan showed mild bilateral pleural effusion and No PE.He had bilateral lower extremities edema and oxygen sats 94 % on 2 Liters.He had held his lasix as told by his cardiologist and presented to ED with Shortness of breath. He was diuresed with I.V lasix twice daily then switched to oral lasix 20 mg after he had an episode of dizziness and his pacemaker could not record an arrhythmia.     He has worked well with PT/OT now stable for discharge home with wife.He will be discharged home with Home health PT/OT to continue with ROM, Exercise, Gait stability and muscle strengthening.He will also require Home Health Nurse for left  knee surgical incision care.He will require DME Nebulizer machine with supplies to use with Nebulizing solution every 6 hours for COPD.He has own Rolling walker for ambulation. Home health services will be arranged by facility social worker prior to discharge.Prescription medication will be written x 1 month then patient to follow up with PCP in 1-2 weeks.He states left knee pain well controlled.He denies any acute issues this visit. Facility staff report no new concerns.     Past Medical History:  Diagnosis Date  . Arthritis   . Atrial fibrillation ([place])   . BPH with obstruction/lower urinary tract symptoms   . Cardiomyopathy ([place])   . Dyslipidemia   . Emphysema of lung ([place])   . Gout   . Hyperlipidemia   . Hypertension   . Hypogonadism in male   . Lung disease   . Macular degeneration   . Obstructive sleep apnea   . Organic impotence   . Pacemaker   . Pacemaker   . Skin cancer   . Symptomatic bradycardia   . Wears hearing aid in both ears     Past Surgical History:  Procedure Laterality Date  . avastin injections     . cancerous lesion removal    . CARDIAC PACEMAKER PLACEMENT    . cervical fusion    . COLONOSCOPY    . cryoablation    . cryoablation    . EYE SURGERY    . JOINT REPLACEMENT    . NECK SURGERY    . NECK SURGERY    .  PACEMAKER INSERTION    . REPLACEMENT TOTAL KNEE    . totoal knee replacement     . VASECTOMY        reports that he has quit smoking. He has a 50.00 pack-year smoking history. He has never used smokeless tobacco. He reports current alcohol use. He reports that he does not use drugs. Social History   Socioeconomic History  . Marital status: Married    Spouse name: Not on file  . Number of children: Not on file  . Years of education: Not on file  . Highest education level: Not on file  Occupational History  . Not on file  Tobacco Use  . Smoking status: Former Smoker    Packs/day: 2.50    Years: 20.00    Pack years: 50.00  .  Smokeless tobacco: Never Used  Substance and Sexual Activity  . Alcohol use: Yes    Comment: 2-3 drinks daily  . Drug use: No  . Sexual activity: Not Currently  Other Topics Concern  . Not on file  Social History Narrative  . Not on file   Social Determinants of Health   Financial Resource Strain:   . Difficulty of Paying Living Expenses: Not on file  Food Insecurity:   . Worried About Charity fundraiser in the Last Year: Not on file  . Ran Out of Food in the Last Year: Not on file  Transportation Needs:   . Lack of Transportation (Medical): Not on file  . Lack of Transportation (Non-Medical): Not on file  Physical Activity:   . Days of Exercise per Week: Not on file  . Minutes of Exercise per Session: Not on file  Stress:   . Feeling of Stress : Not on file  Social Connections:   . Frequency of Communication with Friends and Family: Not on file  . Frequency of Social Gatherings with Friends and Family: Not on file  . Attends Religious Services: Not on file  . Active Member of Clubs or Organizations: Not on file  . Attends Archivist Meetings: Not on file  . Marital Status: Not on file  Intimate Partner Violence:   . Fear of Current or Ex-Partner: Not on file  . Emotionally Abused: Not on file  . Physically Abused: Not on file  . Sexually Abused: Not on file   Functional Status Survey:    Allergies  Allergen Reactions  . Bee Pollen Swelling  . Hctz [Hydrochlorothiazide]     Gout   . Lisinopril Cough    Pertinent  Health Maintenance Due  Topic Date Due  . INFLUENZA VACCINE  Completed  . PNA vac Low Risk Adult  Completed    Medications: Outpatient Encounter Medications as of 01/20/2020  Medication Sig  . albuterol (VENTOLIN HFA) 108 (90 Base) MCG/ACT inhaler Inhale 2 puffs into the lungs every 6 (six) hours as needed for wheezing or shortness of breath.  Marland Kitchen apixaban (ELIQUIS) 5 MG TABS tablet Take 1 tablet (5 mg total) by mouth 2 (two) times daily.   Marland Kitchen atorvastatin (LIPITOR) 10 MG tablet Take 1 tablet (10 mg total) by mouth daily.  . bisacodyl (DULCOLAX) 10 MG suppository Place 10 mg rectally as needed for moderate constipation.  . budesonide-formoterol (SYMBICORT) 160-4.5 MCG/ACT inhaler Inhale 2 puffs into the lungs 2 (two) times daily.  . cholecalciferol (VITAMIN D) 1000 UNITS tablet Take 1,000 Units by mouth in the morning and at bedtime.   Marland Kitchen diltiazem (DILTIAZEM CD) 180 MG 24  hr capsule Take 1 capsule (180 mg total) by mouth daily.  . dorzolamide-timolol (COSOPT) 22.3-6.8 MG/ML ophthalmic solution 1 drop 2 (two) times daily.  Marland Kitchen DOXYCYCLINE PO Take 100 mg by mouth in the morning and at bedtime. 2 tabs po bid   . EPINEPHrine 0.3 mg/0.3 mL IJ SOAJ injection Inject 0.3 mg into the muscle as needed for anaphylaxis.  . finasteride (PROSCAR) 5 MG tablet Take 1 tablet (5 mg total) by mouth daily.  . furosemide (LASIX) 20 MG tablet Take 1 tablet (20 mg total) by mouth daily. Take extra dose of 20mg  as needed.  Marland Kitchen HYDROcodone-acetaminophen (NORCO/VICODIN) 5-325 MG tablet Take 1 tablet by mouth every 6 (six) hours as needed for moderate pain.  Marland Kitchen ipratropium-albuterol (DUONEB) 0.5-2.5 (3) MG/3ML SOLN Take 3 mLs by nebulization as needed.  Marland Kitchen losartan (COZAAR) 100 MG tablet Take 1 tablet (100 mg total) by mouth daily.  . Magnesium Hydroxide (MILK OF MAGNESIA PO) Take 30 mLs by mouth as needed.  . meclizine (ANTIVERT) 12.5 MG tablet Take 1 tablet (12.5 mg total) by mouth in the morning and at bedtime.  . Multiple Vitamins-Minerals (SYSTANE ICAPS AREDS2 PO) Take by mouth.  . polyethylene glycol (MIRALAX / GLYCOLAX) 17 g packet Take 17 g by mouth daily.  Marland Kitchen POTASSIUM CHLORIDE ER PO Take 20 mEq by mouth. 1 tablet by mouth daily for Hypokalemia  . saccharomyces boulardii (FLORASTOR) 250 MG capsule Take 250 mg by mouth 2 (two) times daily.  Marland Kitchen senna (SENOKOT) 8.6 MG TABS tablet Take 1 tablet by mouth.  . Sodium Phosphates (RA SALINE ENEMA RE) Place rectally  as needed.  . sotalol (BETAPACE) 80 MG tablet Take 1 tablet (80 mg total) by mouth 2 (two) times daily.  . tamsulosin (FLOMAX) 0.4 MG CAPS capsule Take 1 capsule (0.4 mg total) by mouth daily.  . vitamin B-12 (CYANOCOBALAMIN) 1000 MCG tablet Take 1,000 mcg by mouth daily.  . [DISCONTINUED] albuterol (VENTOLIN HFA) 108 (90 Base) MCG/ACT inhaler Inhale 2 puffs into the lungs every 6 (six) hours as needed for wheezing or shortness of breath.  . [DISCONTINUED] apixaban (ELIQUIS) 5 MG TABS tablet Take 1 tablet (5 mg total) by mouth 2 (two) times daily.  . [DISCONTINUED] atorvastatin (LIPITOR) 10 MG tablet Take 10 mg by mouth daily.  . [DISCONTINUED] budesonide-formoterol (SYMBICORT) 160-4.5 MCG/ACT inhaler Inhale 2 puffs into the lungs 2 (two) times daily.  . [DISCONTINUED] diltiazem (DILTIAZEM CD) 180 MG 24 hr capsule Take 180 mg by mouth daily.  . [DISCONTINUED] finasteride (PROSCAR) 5 MG tablet Take 5 mg by mouth daily.  . [DISCONTINUED] furosemide (LASIX) 20 MG tablet Take 20 mg by mouth daily. Take extra dose of 20mg  as needed.  . [DISCONTINUED] HYDROcodone-acetaminophen (NORCO/VICODIN) 5-325 MG tablet Take 1 tablet by mouth every 6 (six) hours as needed for moderate pain.  . [DISCONTINUED] ipratropium-albuterol (DUONEB) 0.5-2.5 (3) MG/3ML SOLN Take 3 mLs by nebulization as needed.  . [DISCONTINUED] losartan (COZAAR) 100 MG tablet Take 100 mg by mouth daily.  . [DISCONTINUED] meclizine (ANTIVERT) 12.5 MG tablet Take 12.5 mg by mouth in the morning and at bedtime.   . [DISCONTINUED] sotalol (BETAPACE) 80 MG tablet Take 80 mg by mouth 2 (two) times daily.  . [DISCONTINUED] tamsulosin (FLOMAX) 0.4 MG CAPS capsule Take 0.4 mg by mouth.  . [DISCONTINUED] simethicone (MYLICON) 80 MG chewable tablet Chew 80 mg by mouth.   No facility-administered encounter medications on file as of 01/20/2020.     Review of Systems  Constitutional:  Negative for appetite change, chills, fatigue, fever and unexpected  weight change.  HENT: Negative for congestion, rhinorrhea, sinus pressure, sinus pain, sneezing, sore throat and trouble swallowing.   Eyes: Negative for discharge, redness, itching and visual disturbance.  Respiratory: Negative for cough, chest tightness, shortness of breath and wheezing.   Cardiovascular: Positive for leg swelling. Negative for chest pain and palpitations.  Gastrointestinal: Negative for abdominal distention, abdominal pain, constipation, diarrhea, nausea and vomiting.  Endocrine: Negative for cold intolerance, heat intolerance, polydipsia, polyphagia and polyuria.  Genitourinary: Negative for difficulty urinating, dysuria, flank pain, frequency and urgency.  Musculoskeletal: Positive for arthralgias and gait problem. Negative for joint swelling, myalgias and neck pain.  Skin: Negative for color change, pallor and rash.  Neurological: Negative for dizziness, speech difficulty, weakness, light-headedness, numbness and headaches.  Hematological: Does not bruise/bleed easily.  Psychiatric/Behavioral: Negative for agitation, behavioral problems, confusion and sleep disturbance. The patient is not nervous/anxious.     Vitals:   01/20/20 1610  BP: 132/70  Pulse: 70  Resp: 18  Temp: 98 F (36.7 C)  Weight: 238 lb (108 kg)  Height: 6\' 2"  (1.88 m)   Body mass index is 30.56 kg/m.  Physical Exam Vitals and nursing note reviewed.  Constitutional:      General: He is not in acute distress.    Appearance: He is obese. He is not ill-appearing.  HENT:     Head: Normocephalic.     Nose: Nose normal. No congestion or rhinorrhea.     Mouth/Throat:     Mouth: Mucous membranes are moist.     Pharynx: Oropharynx is clear. No oropharyngeal exudate or posterior oropharyngeal erythema.  Eyes:     General: No scleral icterus.       Right eye: No discharge.        Left eye: No discharge.     Extraocular Movements: Extraocular movements intact.     Conjunctiva/sclera: Conjunctivae  normal.     Pupils: Pupils are equal, round, and reactive to light.  Neck:     Vascular: No carotid bruit.  Cardiovascular:     Rate and Rhythm: Normal rate and regular rhythm.     Pulses: Normal pulses.     Heart sounds: Normal heart sounds. No murmur heard.  No friction rub. No gallop.   Pulmonary:     Effort: Pulmonary effort is normal. No respiratory distress.     Breath sounds: Normal breath sounds. No wheezing, rhonchi or rales.  Chest:     Chest wall: No tenderness.  Abdominal:     General: Bowel sounds are normal. There is no distension.     Palpations: Abdomen is soft. There is no mass.     Tenderness: There is no abdominal tenderness. There is no right CVA tenderness, left CVA tenderness, guarding or rebound.  Musculoskeletal:        General: No swelling or tenderness.     Cervical back: Normal range of motion. No rigidity or tenderness.     Right lower leg: No edema.     Left lower leg: Edema present.     Comments: Unsteady gait   Lymphadenopathy:     Cervical: No cervical adenopathy.  Skin:    General: Skin is warm and dry.     Coloration: Skin is not pale.     Findings: No bruising, erythema or rash.     Comments: Left knee surgical incision with dry dressing,no redness or drainage noted.   Neurological:  Mental Status: He is alert and oriented to person, place, and time.     Cranial Nerves: No cranial nerve deficit.     Sensory: No sensory deficit.     Motor: No weakness.     Gait: Gait abnormal.  Psychiatric:        Mood and Affect: Mood normal.        Behavior: Behavior normal.        Thought Content: Thought content normal.        Judgment: Judgment normal.    Labs reviewed: Basic Metabolic Panel: Recent Labs    01/08/20 0000 01/08/20 0000 01/09/20 0000 01/10/20 0000 01/17/20 0000  NA 133*   < > 134* 132* 136*  K 3.2*   < > 3.6 3.4 4.7  CL 97*   < > 99 99 101  CO2 29*   < > 30* 29* 25*  BUN 18  --  20  --  16  CREATININE 1.1   < > 1.1 1.1  1.1  CALCIUM 9.8  --   --  9.8 10.6   < > = values in this interval not displayed.   CBC: Recent Labs    01/12/20 0000 01/17/20 0000  WBC 11.1 8.5  HGB 10.4* 9.8*  HCT 30* 29*  PLT 262 310   Procedures and Imaging Studies During Stay: No results found.  Assessment/Plan:     1. Unsteady gait Has worked well with PT/ OT.He will discharge with home health  PT/OT to continue with ROM, Exercise, Gait stability and muscle strengthening.Ambulates with own Rolling walker. Fall and safety precautions.  2. Total knee replacement status, left Status post left knee Total Arthroplasty Pain uncontrolled on current pain regimen.continue on Norco and wean off as tolerated.  - HYDROcodone-acetaminophen (NORCO/VICODIN) 5-325 MG tablet; Take 1 tablet by mouth every 6 (six) hours as needed for moderate pain.  Dispense: 30 tablet; Refill: 0 - CBC, BMP in 1-2 weeks PCP   3. Paroxysmal atrial fibrillation (HCC) HR regular - continue on apixaban and sotalol - apixaban (ELIQUIS) 5 MG TABS tablet; Take 1 tablet (5 mg total) by mouth 2 (two) times daily.  Dispense: 60 tablet; Refill: 0 - sotalol (BETAPACE) 80 MG tablet; Take 1 tablet (80 mg total) by mouth 2 (two) times daily.  Dispense: 60 tablet; Refill: 0 - CBC  in 1-2 weeks PCP   4. Stage 2 moderate COPD by GOLD classification [facility]) S/p hospital admission with SOB as above. Breathing stable on room air  - continue on current Nebs - albuterol (VENTOLIN HFA) 108 (90 Base) MCG/ACT inhaler; Inhale 2 puffs into the lungs every 6 (six) hours as needed for wheezing or shortness of breath.  Dispense: 18 g; Refill: 0 - budesonide-formoterol (SYMBICORT) 160-4.5 MCG/ACT inhaler; Inhale 2 puffs into the lungs 2 (two) times daily.  Dispense: 10.2 g; Refill: 0 - ipratropium-albuterol (DUONEB) 0.5-2.5 (3) MG/3ML SOLN; Take 3 mLs by nebulization as needed.  Dispense: 360 mL; Refill: 0  5. OSA (obstructive sleep apnea) Not using any CPAP at bedtime.   6.  Benign prostatic hyperplasia without lower urinary tract symptoms Asymptomatic. Continue on finasteride and tamsulosin. - finasteride (PROSCAR) 5 MG tablet; Take 1 tablet (5 mg total) by mouth daily.  Dispense: 30 tablet; Refill: 0 - tamsulosin (FLOMAX) 0.4 MG CAPS capsule; Take 1 capsule (0.4 mg total) by mouth daily.  Dispense: 30 capsule; Refill: 0  7. Hyperlipidemia LDL goal <100 No latest LDL for review.will defer to PCP Continue on  atorvastatin  - atorvastatin (LIPITOR) 10 MG tablet; Take 1 tablet (10 mg total) by mouth daily.  Dispense: 30 tablet; Refill: 0  8. Pulmonary edema with congestive heart failure ([place]) Status post hospital admission for SOB.symptoms have improved  Continue on diltiazem and Furosemide  - diltiazem (DILTIAZEM CD) 180 MG 24 hr capsule; Take 1 capsule (180 mg total) by mouth daily.  Dispense: 30 capsule; Refill: 0 - furosemide (LASIX) 20 MG tablet; Take 1 tablet (20 mg total) by mouth daily. Take extra dose of 20mg  as needed.  Dispense: 30 tablet; Refill: 0 - Advised to monitor weight and notify provider for any abrupt weight gain,worsening cough or shortness of breath.  9. Vertigo Symptoms controlled. Continue on meclizine  - meclizine (ANTIVERT) 12.5 MG tablet; Take 1 tablet (12.5 mg total) by mouth in the morning and at bedtime.  Dispense: 60 tablet; Refill: 0  10. Essential hypertension B/p stable. Continue current medication  - atorvastatin (LIPITOR) 10 MG tablet; Take 1 tablet (10 mg total) by mouth daily.  Dispense: 30 tablet; Refill: 0 - diltiazem (DILTIAZEM CD) 180 MG 24 hr capsule; Take 1 capsule (180 mg total) by mouth daily.  Dispense: 30 capsule; Refill: 0 - furosemide (LASIX) 20 MG tablet; Take 1 tablet (20 mg total) by mouth daily. Take extra dose of 20mg  as needed.  Dispense: 30 tablet; Refill: 0 - losartan (COZAAR) 100 MG tablet; Take 1 tablet (100 mg total) by mouth daily.  Dispense: 30 tablet; Refill: 0 - sotalol (BETAPACE) 80 MG tablet;  Take 1 tablet (80 mg total) by mouth 2 (two) times daily.  Dispense: 60 tablet; Refill: 0 CBC, BMP in 1-2 weeks PCP   Patient is being discharged with the following home health services:   -PT/OT for ROM, exercise, gait stability and muscle strengthening  -  HH RN for surgical incision care   Patient is being discharged with the following durable medical equipment:   -  Nebulizer machine with supplies to use with Nebulizing solution every 6 hours for COPD.  Patient has been advised to f/u with their PCP in 1-2 weeks to for a transitions of care visit.Social services at their facility was responsible for arranging this appointment.  Pt was provided with adequate prescriptions of noncontrolled medications to reach the scheduled appointment.For controlled substances, a limited supply was provided as appropriate for the individual patient. If the pt normally receives these medications from a pain clinic or has a contract with another physician, these medications should be received from that clinic or physician only).    Future labs/tests needed:  CBC, BMP in 1-2 weeks PCP

## 2021-05-08 ENCOUNTER — Encounter (HOSPITAL_BASED_OUTPATIENT_CLINIC_OR_DEPARTMENT_OTHER): Payer: Self-pay

## 2021-05-08 ENCOUNTER — Emergency Department (HOSPITAL_BASED_OUTPATIENT_CLINIC_OR_DEPARTMENT_OTHER): Payer: Medicare Other

## 2021-05-08 ENCOUNTER — Other Ambulatory Visit: Payer: Self-pay

## 2021-05-08 ENCOUNTER — Emergency Department (HOSPITAL_BASED_OUTPATIENT_CLINIC_OR_DEPARTMENT_OTHER)
Admission: EM | Admit: 2021-05-08 | Discharge: 2021-05-08 | Disposition: A | Discharge status: Home / Self Care | Payer: Medicare Other | Attending: Emergency Medicine | Admitting: Emergency Medicine

## 2021-05-08 DIAGNOSIS — I10 Essential (primary) hypertension: Secondary | ICD-10-CM | POA: Diagnosis not present

## 2021-05-08 DIAGNOSIS — Z950 Presence of cardiac pacemaker: Secondary | ICD-10-CM | POA: Insufficient documentation

## 2021-05-08 DIAGNOSIS — Z7901 Long term (current) use of anticoagulants: Secondary | ICD-10-CM | POA: Insufficient documentation

## 2021-05-08 DIAGNOSIS — R609 Edema, unspecified: Secondary | ICD-10-CM | POA: Diagnosis not present

## 2021-05-08 DIAGNOSIS — R002 Palpitations: Secondary | ICD-10-CM | POA: Diagnosis present

## 2021-05-08 DIAGNOSIS — K649 Unspecified hemorrhoids: Secondary | ICD-10-CM | POA: Insufficient documentation

## 2021-05-08 DIAGNOSIS — I4891 Unspecified atrial fibrillation: Secondary | ICD-10-CM

## 2021-05-08 LAB — CBC
HCT: 37.6 % — ABNORMAL LOW (ref 39.0–52.0)
Hemoglobin: 12.9 g/dL — ABNORMAL LOW (ref 13.0–17.0)
MCH: 33.1 pg (ref 26.0–34.0)
MCHC: 34.3 g/dL (ref 30.0–36.0)
MCV: 96.4 fL (ref 80.0–100.0)
Platelets: 162 10*3/uL (ref 150–400)
RBC: 3.9 MIL/uL — ABNORMAL LOW (ref 4.22–5.81)
RDW: 14.4 % (ref 11.5–15.5)
WBC: 7.9 10*3/uL (ref 4.0–10.5)
nRBC: 0 % (ref 0.0–0.2)

## 2021-05-08 LAB — BASIC METABOLIC PANEL
Anion gap: 7 (ref 5–15)
BUN: 28 mg/dL — ABNORMAL HIGH (ref 8–23)
CO2: 25 mmol/L (ref 22–32)
Calcium: 9.7 mg/dL (ref 8.9–10.3)
Chloride: 101 mmol/L (ref 98–111)
Creatinine, Ser: 1.33 mg/dL — ABNORMAL HIGH (ref 0.61–1.24)
GFR, Estimated: 53 mL/min — ABNORMAL LOW (ref >60)
Glucose, Bld: 99 mg/dL (ref 70–99)
Potassium: 4 mmol/L (ref 3.5–5.1)
Sodium: 133 mmol/L — ABNORMAL LOW (ref 135–145)

## 2021-05-08 LAB — TROPONIN I (HIGH SENSITIVITY): Troponin I (High Sensitivity): 17 ng/L (ref <18)

## 2021-05-08 NOTE — ED Notes (Signed)
[facility] contacted via phone for pacemaker interrogation report

## 2021-05-08 NOTE — ED Notes (Signed)
Patient verbalizes understanding of discharge instructions. Opportunity for questioning and answers were provided. Armband removed by staff, pt discharged from ED. Ambulated out to lobby  

## 2021-05-08 NOTE — Plan of Care (Signed)
ED to Cardiology Discussion  Christian Lin is an 87yo male with paroxysmal Afib on eliquis, dose reduced to 2.5mg  BID due to hemorrhoidal bleeding. Today felt poor, and pt noted his HR. In ED rate controlled in the 80s-90s. Pacemaker with periodic ventricular paced beats. Labs unremarkable.  Discussed two options with Dr Tamera Punt including (1) sending home with outpatient cardiology follow up in the next 7-14 days, and if he has not self converted to NSR outpatient cardioversion could be arranged, or (2) ED cardioversion as patient has not missed any doses of his eliquis in the past 30 days. No firm indications for admission. Dr Tamera Punt to discuss with patient.   Lorenda Cahill, MD Cardiology

## 2021-05-08 NOTE — ED Triage Notes (Signed)
Pt to er, pt states that his heart rate has been up and down, states that he has a hx of a fib and feels like he is in a fib at this time.

## 2021-05-08 NOTE — Discharge Instructions (Signed)
Call tomorrow to follow-up with your cardiologist.  Return to emergency room if you have any worsening symptoms or increased heart rate.

## 2021-05-08 NOTE — ED Provider Notes (Signed)
[facility] Provider Note   CSN: [id number] Arrival date & time: 05/08/21  1843     History  Chief Complaint  Patient presents with   Palpitations    [facility] is a 87 y.o. male.  Patient is a 87 year old male who presents with palpitations.  He has a history of A-fib and says he feels like he is back in A-fib.  He said he is felt a little bit "out of sorts" today.  He denies any dizziness.  No chest pain or shortness of breath.  No leg swelling.  No fevers.  No cough or cold symptoms.  Per chart review, he has a history of atrial fibrillation and is on Eliquis although he states they recently lowered his Eliquis due to some hemorrhoidal bleeding.  He now takes 2.5 mg twice daily.  He also has a history of hypertension, hyperlipidemia, BPH and has a Biotronik pacemaker.      Home Medications Prior to Admission medications   Medication Sig Start Date End Date Taking? Authorizing Provider  albuterol (VENTOLIN HFA) 108 (90 Base) MCG/ACT inhaler Inhale 2 puffs into the lungs every 6 (six) hours as needed for wheezing or shortness of breath. 01/20/20   Ngetich, Dinah C, NP  apixaban (ELIQUIS) 5 MG TABS tablet Take 1 tablet (5 mg total) by mouth 2 (two) times daily. 01/20/20   Ngetich, Dinah C, NP  atorvastatin (LIPITOR) 10 MG tablet Take 1 tablet (10 mg total) by mouth daily. 01/20/20   Ngetich, Dinah C, NP  bisacodyl (DULCOLAX) 10 MG suppository Place 10 mg rectally as needed for moderate constipation.    [provider]  budesonide-formoterol (SYMBICORT) 160-4.5 MCG/ACT inhaler Inhale 2 puffs into the lungs 2 (two) times daily. 01/20/20   Ngetich, Dinah C, NP  cholecalciferol (VITAMIN D) 1000 UNITS tablet Take 1,000 Units by mouth in the morning and at bedtime.     [provider]  diltiazem (DILTIAZEM CD) 180 MG 24 hr capsule Take 1 capsule (180 mg total) by mouth daily. 01/20/20   Ngetich, Dinah C, NP  dorzolamide-timolol (COSOPT)  22.3-6.8 MG/ML ophthalmic solution 1 drop 2 (two) times daily.    [provider]  EPINEPHrine 0.3 mg/0.3 mL IJ SOAJ injection Inject 0.3 mg into the muscle as needed for anaphylaxis.    [provider]  finasteride (PROSCAR) 5 MG tablet Take 1 tablet (5 mg total) by mouth daily. 01/20/20   Ngetich, Dinah C, NP  furosemide (LASIX) 20 MG tablet Take 1 tablet (20 mg total) by mouth daily. Take extra dose of 20mg  as needed. 01/20/20   Ngetich, Dinah C, NP  HYDROcodone-acetaminophen (NORCO/VICODIN) 5-325 MG tablet Take 1 tablet by mouth every 6 (six) hours as needed for moderate pain. 01/20/20   Ngetich, Dinah C, NP  ipratropium-albuterol (DUONEB) 0.5-2.5 (3) MG/3ML SOLN Take 3 mLs by nebulization as needed. 01/20/20   Ngetich, Dinah C, NP  losartan (COZAAR) 100 MG tablet Take 1 tablet (100 mg total) by mouth daily. 01/20/20   Ngetich, Dinah C, NP  Magnesium Hydroxide (MILK OF MAGNESIA PO) Take 30 mLs by mouth as needed.    [provider]  meclizine (ANTIVERT) 12.5 MG tablet Take 1 tablet (12.5 mg total) by mouth in the morning and at bedtime. 01/20/20   Ngetich, Dinah C, NP  Multiple Vitamins-Minerals (SYSTANE ICAPS AREDS2 PO) Take by mouth.    [provider]  polyethylene glycol (MIRALAX / GLYCOLAX) 17 g packet Take 17 g by mouth  daily.    [provider]  POTASSIUM CHLORIDE ER PO Take 20 mEq by mouth. 1 tablet by mouth daily for Hypokalemia    [provider]  senna (SENOKOT) 8.6 MG TABS tablet Take 1 tablet by mouth.    [provider]  Sodium Phosphates (RA SALINE ENEMA RE) Place rectally as needed.    [provider]  sotalol (BETAPACE) 80 MG tablet Take 1 tablet (80 mg total) by mouth 2 (two) times daily. 01/20/20   Ngetich, Dinah C, NP  tamsulosin (FLOMAX) 0.4 MG CAPS capsule Take 1 capsule (0.4 mg total) by mouth daily. 01/20/20   Ngetich, Dinah C, NP  vitamin B-12 (CYANOCOBALAMIN) 1000 MCG tablet Take 1,000 mcg by mouth daily.     [provider]      Allergies    Bee pollen, Hctz [hydrochlorothiazide], and Lisinopril    Review of Systems   Review of Systems  Constitutional:  Positive for fatigue. Negative for chills, diaphoresis and fever.  HENT:  Negative for congestion, rhinorrhea and sneezing.   Eyes: Negative.   Respiratory:  Negative for cough, chest tightness and shortness of breath.   Cardiovascular:  Positive for palpitations. Negative for chest pain and leg swelling.  Gastrointestinal:  Negative for abdominal pain, blood in stool, diarrhea, nausea and vomiting.  Genitourinary:  Negative for difficulty urinating, flank pain, frequency and hematuria.  Musculoskeletal:  Negative for arthralgias and back pain.  Skin:  Negative for rash.  Neurological:  Negative for dizziness, speech difficulty, weakness, numbness and headaches.   Physical Exam Updated Vital Signs BP 115/69    Pulse 81    Temp 97.8 F (36.6 C) (Oral)    Resp 20    Ht 6\' 2"  (1.88 m)    Wt 99.8 kg    SpO2 97%    BMI 28.25 kg/m  Physical Exam Constitutional:      Appearance: He is well-developed.  HENT:     Head: Normocephalic and atraumatic.  Eyes:     Pupils: Pupils are equal, round, and reactive to light.  Cardiovascular:     Rate and Rhythm: Normal rate. Rhythm irregular.     Heart sounds: Normal heart sounds.  Pulmonary:     Effort: Pulmonary effort is normal. No respiratory distress.     Breath sounds: Normal breath sounds. No wheezing or rales.  Chest:     Chest wall: No tenderness.  Abdominal:     General: Bowel sounds are normal.     Palpations: Abdomen is soft.     Tenderness: There is no abdominal tenderness. There is no guarding or rebound.  Musculoskeletal:        General: Normal range of motion.     Cervical back: Normal range of motion and neck supple.     Comments: Trace edema, no calf tenderness  Lymphadenopathy:     Cervical: No cervical adenopathy.  Skin:    General: Skin is warm and dry.      Findings: No rash.  Neurological:     Mental Status: He is alert and oriented to person, place, and time.    ED Results / Procedures / Treatments   Labs (all labs ordered are listed, but only abnormal results are displayed) Labs Reviewed  BASIC METABOLIC PANEL - Abnormal; Notable for the following components:      Result Value   Sodium 133 (*)    BUN 28 (*)    Creatinine, Ser 1.33 (*)    GFR, Estimated 53 (*)  All other components within normal limits  CBC - Abnormal; Notable for the following components:   RBC 3.90 (*)    Hemoglobin 12.9 (*)    HCT 37.6 (*)    All other components within normal limits  TROPONIN I (HIGH SENSITIVITY)  TROPONIN I (HIGH SENSITIVITY)    EKG EKG Interpretation  Date/Time:  Tuesday May 08 2021 18:54:47 EST Ventricular Rate:  73 PR Interval:    QRS Duration: 136 QT Interval:  426 QTC Calculation: 469 R Axis:   54 Text Interpretation: Atrial fibrillation with frequent ventricular-paced complexes Right bundle branch block Abnormal ECG When compared with ECG of 25-May-2017 17:15, PREVIOUS ECG IS PRESENT Confirmed by Malvin Johns [phone]) on 05/08/2021 7:22:13 PM  Radiology DG Chest 2 View  Result Date: 05/08/2021 CLINICAL DATA:  Palpitations, chest pain EXAM: CHEST - 2 VIEW COMPARISON:  01/06/2020 FINDINGS: Mild patchy density at the right lung base. No pleural effusion or pneumothorax. Cardiomediastinal contours are within normal limits. Left chest wall dual lead pacemaker. No acute osseous abnormality. IMPRESSION: Patchy density at the right lung base may reflect atelectasis or scarring. Electronically Signed   By: Macy Mis M.D.   On: 05/08/2021 19:51    Procedures Procedures    Medications Ordered in ED Medications - No data to display  ED Course/ Medical Decision Making/ A&P                           Medical Decision Making Amount and/or Complexity of Data Reviewed Labs: ordered. Radiology: ordered.   Patient is a  87 year old male who presents with atrial fibrillation.  He feels a little bit out of sorts but overall says he does not feel bad.  He has no associated chest pain or shortness of breath.  No suggestions of fluid overload.  His EKG does show atrial fibrillation with an occasional ventricular paced beat.  His pacemaker was interrogated.  No significant identified runs of tachycardia were noted.  He had labs drawn.  These were reviewed by me.  He has no significant electrolyte abnormalities.  Chest x-ray shows some possible atelectasis at the right lung base.  He does not have any symptoms of pneumonia.  This was interpreted by me.  His atrial fibrillation is at a controlled rate.  He is on Eliquis but at a reduced dose of 2.5 twice daily.  I discussed this with the cardiologist on-call, Dr. Nash Dimmer.  He does not feel that patient needs to be admitted to the hospital.  Given that his rate is controlled, he feels that the patient can be discharged home with close outpatient cardiology follow-up.  The other alternative would be to do an ED cardioversion.  I discussed the options with the patient.  He is opting to go home and follow-up with his cardiologist.  He is choosing not to do an ED cardioversion at this point.  He was advised to call tomorrow for close follow-up.  Strict return precautions were given.  Final Clinical Impression(s) / ED Diagnoses Final diagnoses:  Atrial fibrillation, unspecified type [facility])    Rx / [place] Orders ED Discharge Orders     None         Malvin Johns, MD 05/08/21 2222

## 2022-12-13 IMAGING — CR DG CHEST 2V
2 series · 2 of 2 positions shown · non-contrast
Comparison: 01/06/2020

CLINICAL DATA: Palpitations, chest pain

EXAM:
CHEST - 2 VIEW

[w chest pa]
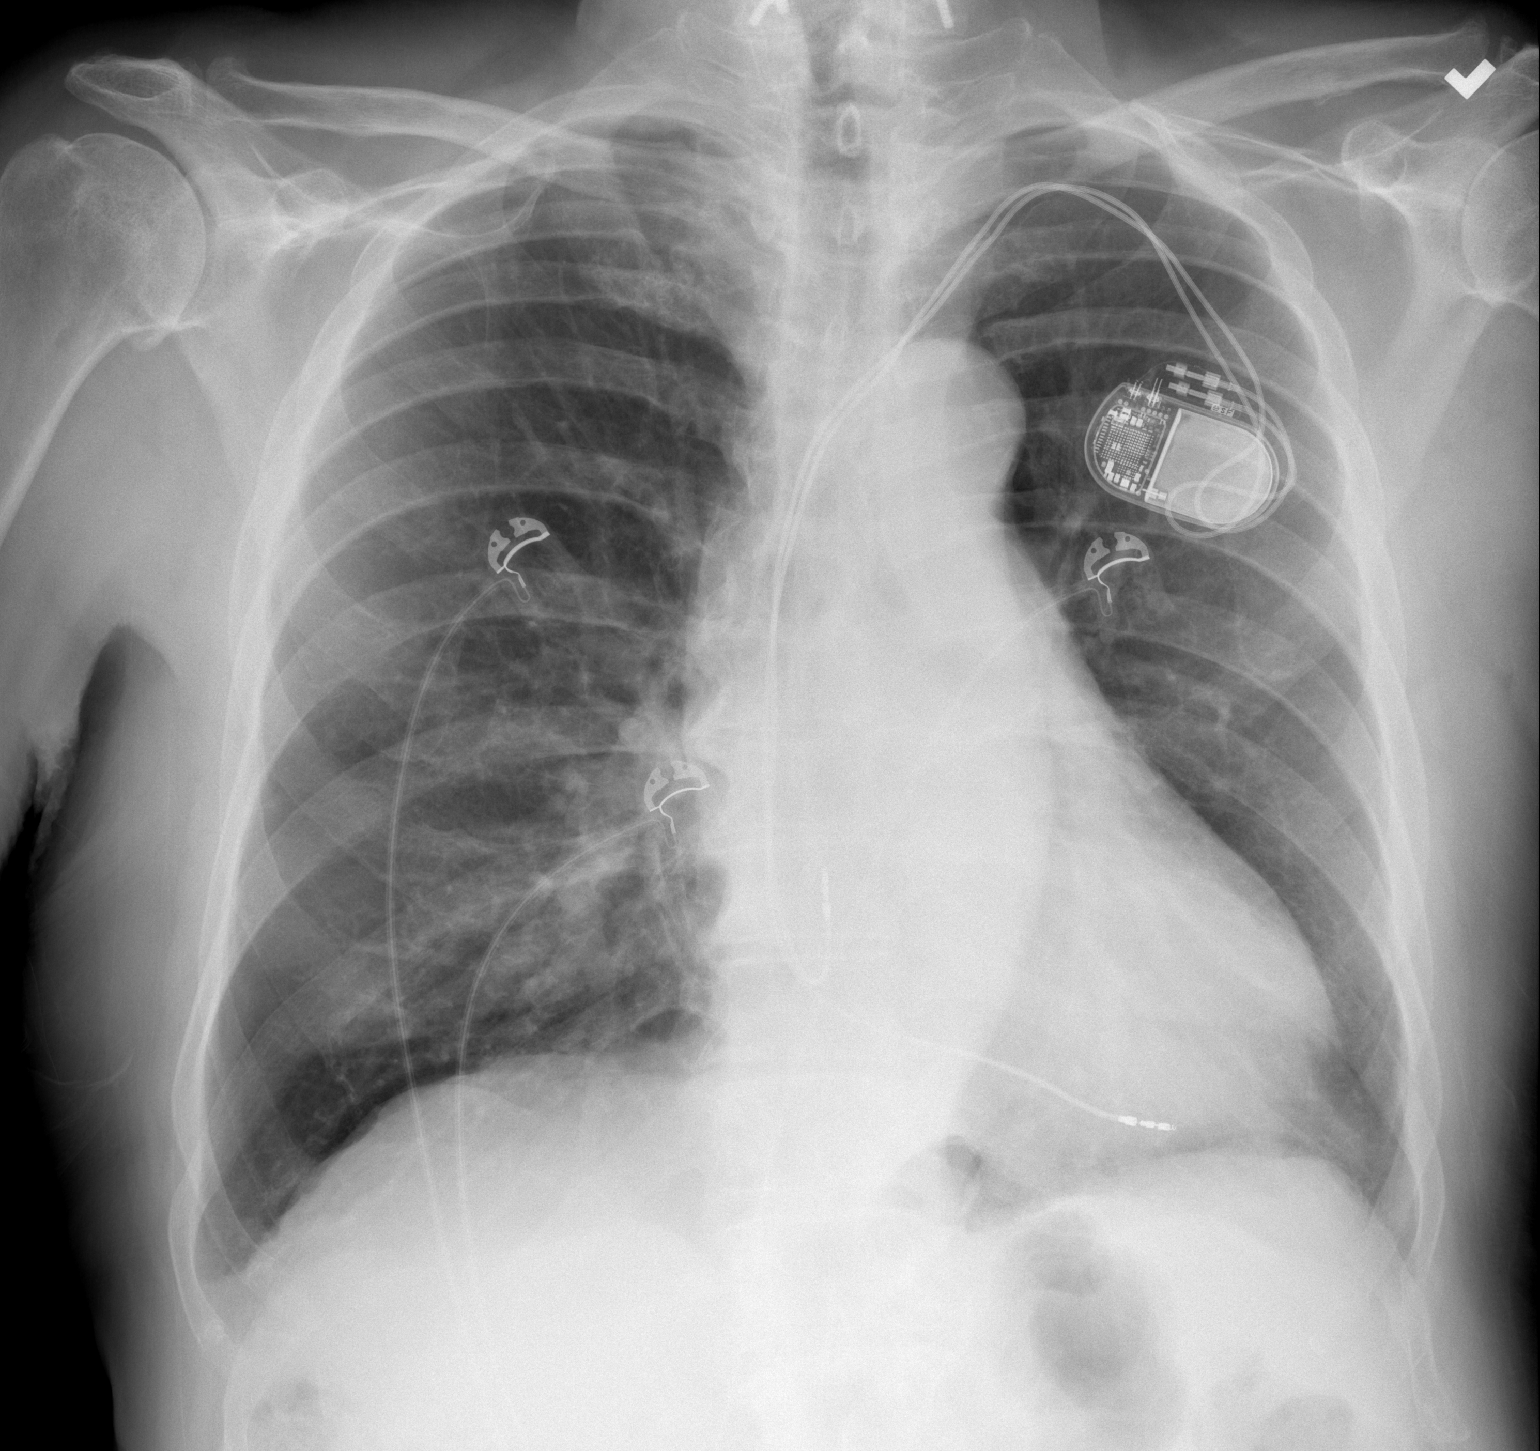

[w chest lat]
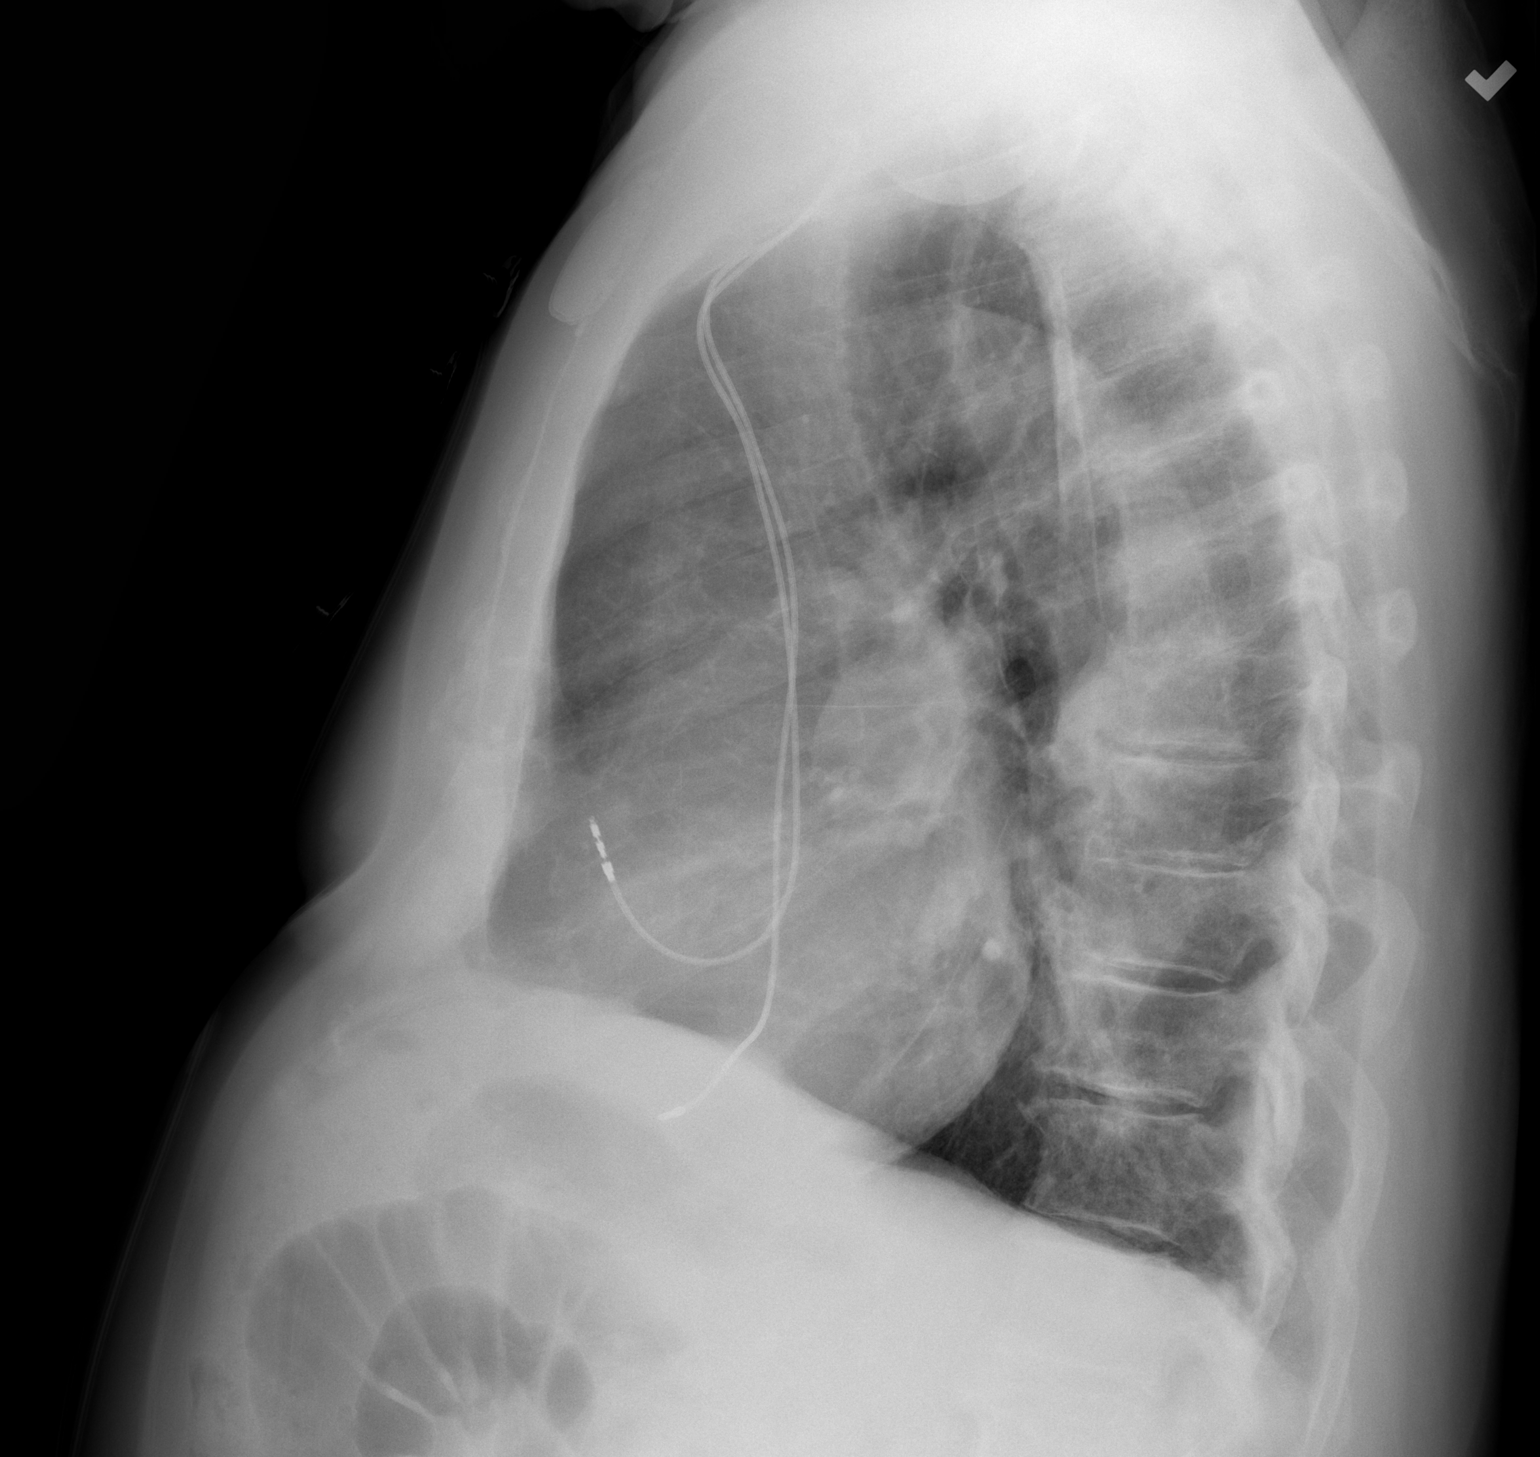

[2 of 2 positions shown; findings below may reference images not displayed]

FINDINGS: Mild patchy density at the right lung base. No pleural effusion or
pneumothorax. Cardiomediastinal contours are within normal limits.
Left chest wall dual lead pacemaker. No acute osseous abnormality.
IMPRESSION: Patchy density at the right lung base may reflect atelectasis or
scarring.

## 2023-07-02 ENCOUNTER — Encounter: Payer: Self-pay | Admitting: Urology

## 2023-07-02 ENCOUNTER — Ambulatory Visit: Admitting: Urology

## 2023-07-02 VITALS — BP 128/85 | HR 87 | Ht 73.0 in | Wt 229.0 lb

## 2023-07-02 DIAGNOSIS — N401 Enlarged prostate with lower urinary tract symptoms: Secondary | ICD-10-CM | POA: Diagnosis not present

## 2023-07-02 DIAGNOSIS — C61 Malignant neoplasm of prostate: Secondary | ICD-10-CM | POA: Insufficient documentation

## 2023-07-02 DIAGNOSIS — N138 Other obstructive and reflux uropathy: Secondary | ICD-10-CM | POA: Diagnosis not present

## 2023-07-02 LAB — URINALYSIS, ROUTINE W REFLEX MICROSCOPIC
Bilirubin, UA: NEGATIVE
Glucose, UA: NEGATIVE
Ketones, UA: NEGATIVE
Leukocytes,UA: NEGATIVE
Nitrite, UA: NEGATIVE
Protein,UA: NEGATIVE
RBC, UA: NEGATIVE
Specific Gravity, UA: 1.01 (ref 1.005–1.030)
Urobilinogen, Ur: 1 mg/dL (ref 0.2–1.0)
pH, UA: 6.5 (ref 5.0–7.5)

## 2023-07-02 NOTE — Progress Notes (Signed)
 Assessment: 1. Prostate cancer (HCC); high risk, T3bN1M0; on ADT   2. BPH with obstruction/lower urinary tract symptoms     Plan: I personally reviewed the patient's chart including provider notes, lab and imaging results. I discussed treatment options for high risk prostate cancer.   Specifically, we discussed the potential benefits and risks of radiation therapy including increased voiding symptoms, rectal symptoms, etc.  I reviewed SpaceOAR procedure and potential benefits of reducing the risk of rectal toxicity.  I recommended that he consider GI evaluation of his rectal bleeding prior to XRT. He is tolerating ADT well and will likely need to continue this long term even with XRT given his high risk disease.  Given his advanced age, I am uncertain if he will ultimately benefit from definitive therapy with XRT.   I recommended that he discuss his options with Dr. Freida Busman and Dr. Ottis Stain. If he wishes to pursue XRT, I will be glad to arrange for placement of fiducial markers and SpaceOAR prior to beginning treatment.   Chief Complaint:  Chief Complaint  Patient presents with   Prostate Cancer    History of Present Illness:  [facility] is a 89 y.o. male who is seen in consultation from Foy Guadalajara, MD for evaluation of high risk prostate cancer. He was diagnosed with prostate cancer in December 2024.  He was initially seen by Dr. Sabino Gasser with [facility] in October 2024.  Prostate examination at that time showed an enlarged prostate with nodularity bilaterally.  He had a history of elevated PSA to 14.29 in August 2024. MRI of the pelvis in October 2024 showed a PI-RADS 5 lesion in the right posterior peripheral zone and a PI-RADS 4 lesion in the right posterior peripheral zone at the apex.  There was also concern for early extraprostatic extension. Prostate volume was 129 ml. He underwent a prostate biopsy on 02/18/2023.  Biopsy showed Gleason 4+3= 7 within the right mid  prostate, Gleason 4+3 = 7 within the right base, Gleason 4+4 = 8 in the right lateral mid prostate and Gleason 4+5 = 9 in the right lateral base. He received a 59-month dose of leuprolide on 03/04/2023. He was seen by Dr. Pandora Leiter in radiation oncology in January 2025.  A PSMA PET scan was ordered which showed a focus of intense activity along the posterior aspect of the prostate gland with extension of activity into the right seminal vesicle and avid presacral lymph nodes compatible with nodal metastases. He was seen by Dr. Aretta Nip with urologic oncology at [facility].  Management with radical prostatectomy as well as cryotherapy was not recommended. He has recently been followed by Dr. Ottis Stain with medical oncology.  He has continued on leuprolide and was started on abiraterone with prednisone in February 2025.  PSA 3/25:  0.12  He presents today to establish his urologic care.  He continues on ADT + abiraterone and prednisone.  He is tolerating these medications well.  No hot flashes.  He is on tamsulosin with mild LUTS.  No dysuria or gross hematuria. IPSS = 7/0.   He is having intermittent rectal bleeding.   Past Medical History:  Past Medical History:  Diagnosis Date   Arthritis    Atrial fibrillation (HCC)    BPH with obstruction/lower urinary tract symptoms    Cardiomyopathy (HCC)    Dyslipidemia    Emphysema of lung (HCC)    Gout    Hyperlipidemia    Hypertension    Hypogonadism in male  Lung disease    Macular degeneration    Obstructive sleep apnea    Organic impotence    Pacemaker    Pacemaker    Skin cancer    Symptomatic bradycardia    Wears hearing aid in both ears     Past Surgical History:  Past Surgical History:  Procedure Laterality Date   avastin injections      cancerous lesion removal     CARDIAC PACEMAKER PLACEMENT     cervical fusion     COLONOSCOPY     cryoablation     cryoablation     EYE SURGERY     JOINT REPLACEMENT     NECK SURGERY      NECK SURGERY     PACEMAKER INSERTION     REPLACEMENT TOTAL KNEE     totoal knee replacement      VASECTOMY      Allergies:  Allergies  Allergen Reactions   Bee Pollen Swelling   Hctz [Hydrochlorothiazide]     Gout    Lisinopril Cough    Other Reaction(s): Cough    Family History:  Family History  Problem Relation Age of Onset   Heart disease Mother    Macular degeneration Mother    Alzheimer's disease Father    Macular degeneration Father    Colon cancer Other     Social History:  Social History   Tobacco Use   Smoking status: Former    Current packs/day: 2.50    Average packs/day: 2.5 packs/day for 20.0 years (50.0 ttl pk-yrs)    Types: Cigarettes   Smokeless tobacco: Never  Vaping Use   Vaping status: Never Used  Substance Use Topics   Alcohol use: Yes    Comment: 2-3 drinks daily   Drug use: No    Review of symptoms:  Constitutional:  Negative for unexplained weight loss, night sweats, fever, chills ENT:  Negative for nose bleeds, sinus pain, painful swallowing CV:  Negative for chest pain, shortness of breath, exercise intolerance, palpitations, loss of consciousness Resp:  Negative for cough, wheezing, shortness of breath GI:  Negative for nausea, vomiting, diarrhea, bloody stools GU:  Positives noted in HPI; otherwise negative for gross hematuria, dysuria, urinary incontinence Neuro:  Negative for seizures, poor balance, limb weakness, slurred speech Psych:  Negative for lack of energy, depression, anxiety Endocrine:  Negative for polydipsia, polyuria, symptoms of hypoglycemia (dizziness, hunger, sweating) Hematologic:  Negative for anemia, purpura, petechia, prolonged or excessive bleeding, use of anticoagulants  Allergic:  Negative for difficulty breathing or choking as a result of exposure to anything; no shellfish allergy; no allergic response (rash/itch) to materials, foods  Physical exam: BP 128/85   Pulse 87   Ht 6\' 1"  (1.854 m)   Wt 229 lb  (103.9 kg)   BMI 30.21 kg/m  GENERAL APPEARANCE:  Well appearing, well developed, well nourished, NAD HEENT: Atraumatic, Normocephalic, oropharynx clear. NECK: Supple without lymphadenopathy or thyromegaly. LUNGS: Clear to auscultation bilaterally. HEART: Regular Rate and Rhythm without murmurs, gallops, or rubs. ABDOMEN: Soft, non-tender, No Masses. EXTREMITIES: Moves all extremities well.  Without clubbing, cyanosis, or edema. NEUROLOGIC:  Alert and oriented x 3, normal gait, CN II-XII grossly intact.  MENTAL STATUS:  Appropriate. BACK:  Non-tender to palpation.  No CVAT SKIN:  Warm, dry and intact.    Results: Results for orders placed or performed in visit on 07/02/23 (from the past 24 hours)  Urinalysis, Routine w reflex microscopic   Collection Time: 07/02/23 12:00 AM  Result Value Ref Range   Specific Gravity, UA 1.010 1.005 - 1.030   pH, UA 6.5 5.0 - 7.5   Color, UA Yellow Yellow   Appearance Ur Clear Clear   Leukocytes,UA Negative Negative   Protein,UA Negative Negative/Trace   Glucose, UA Negative Negative   Ketones, UA Negative Negative   RBC, UA Negative Negative   Bilirubin, UA Negative Negative   Urobilinogen, Ur 1.0 0.2 - 1.0 mg/dL   Nitrite, UA Negative Negative   Microscopic Examination Comment

## 2023-11-04 ENCOUNTER — Telehealth: Payer: Self-pay | Admitting: Urology

## 2023-11-04 NOTE — Telephone Encounter (Signed)
 Nurse Navigator from [facility]  [facility]: [phone] Calling from Dr Leita Scales office. Requesting that a rectal Spaceoar be placed for him prior to starting his radiation treatment.

## 2023-11-05 ENCOUNTER — Other Ambulatory Visit: Payer: Self-pay | Admitting: Urology

## 2023-11-05 DIAGNOSIS — C61 Malignant neoplasm of prostate: Secondary | ICD-10-CM

## 2023-11-25 ENCOUNTER — Telehealth: Payer: Self-pay | Admitting: Urology

## 2023-11-25 NOTE — Telephone Encounter (Signed)
 Pt called about finding more information about his surgery Sep 16. He stated all he knows is the date and time.

## 2023-11-26 ENCOUNTER — Encounter (HOSPITAL_COMMUNITY): Payer: Self-pay | Admitting: Urology

## 2023-11-26 NOTE — Progress Notes (Addendum)
 ADDENDUM: pt to hold Eliquis  2 days prior to procedure per Dr Annis note.   Spoke w/ via phone for pre-op interview--- [facility] needs dos----  Surgeon orders requested 11/26/23. BMP, EKG per anesthesia.       Lab results------ COVID test -----patient states asymptomatic no test needed Arrive at -------1145 NPO after MN NO Solid Food.  Clear liquids from MN until---1045 Pre-Surgery Ensure or G2:  Med rec completed Medications to take morning of surgery ----- Bring Albuterol  inhaler. Symbicort , Diltiazem , Proscar , cosopt, sotolol and flomax . Diabetic medication -----  GLP1 agonist last dose: GLP1 instructions:  Patient instructed no nail polish to be worn day of surgery Patient instructed to bring photo id and insurance card day of surgery Patient aware to have Driver (ride ) / caregiver    for 24 hours after surgery - Bruna Schooner- wife Patient Special Instructions ----- Pre-Op special Instructions -----  Patient verbalized understanding of instructions that were given at this phone interview. Patient denies chest pain, sob, fever, cough at the interview.   Anesthesia Review:  Cardiac clearance in Epic dated 11/18/23 under media tab.  PCP: Dr Otho Cardiologist : Dr Launie ([facility])  PPM/ ICD: Pacemaker Device Orders:  requested 11/26/23 Rep Notified:  Chest x-ray : EKG : Echo : Stress test: Cardiac Cath :   Activity level:  Sleep Study/ CPAP : Fasting Blood Sugar :      / Checks Blood Sugar -- times a day:    Blood Thinner/ Instructions /Last Dose: Eliquis  called surgery scheduler for clarification, pt stated he had not been told how long to hold. ASA / Instructions/ Last Dose :

## 2023-11-26 NOTE — Telephone Encounter (Signed)
LMOM requested a return call.

## 2023-11-27 ENCOUNTER — Telehealth: Payer: Self-pay

## 2023-11-27 ENCOUNTER — Ambulatory Visit: Payer: Self-pay | Admitting: Urology

## 2023-11-27 DIAGNOSIS — C61 Malignant neoplasm of prostate: Secondary | ICD-10-CM

## 2023-11-27 NOTE — Telephone Encounter (Signed)
 Per Dr. Roseann pt can not have surgery next week unless he comes in for a pre-op appt.   Left messages on home and cell phone numbers for pt to call [facility] back.

## 2023-11-27 NOTE — Telephone Encounter (Signed)
 Spoke with pt in reference to pre op appt. Appt made for tomorrow. Pt then asked questions about his medications and DOS. Made pt aware that is a pre admit question. Called pre admit to make aware of pt questions. Pre admit will call pt. Pt made aware and voiced understanding.

## 2023-11-27 NOTE — Progress Notes (Addendum)
 Received from office OR scheduler , Chelsea.  She stated patient had called her asked about if he could take a couple medication's morning of surgery, she gave me the name (prednisone / zytiga) and noted these were not on his med list.  Odette Cree would call patient myself and go over his medication list and let patient know what he can take morning of surgery.  I looked up medication from his radiation oncologist, Dr Selinda Albino, and noted medication had other meds on it. Called and spoke w/ patient via phone , identified pt name/ DOB, and told him we needed to go over his medication list since we did not have the two medications on it he was asking about.  Pt medication list is now updated in epic.  We also went over medications to take morning of surgery-- Stiolto inhaler, sotalol , proscar , flomax , prednisone , zytiga, diltiazem , cosopt eye drop and bring rescue inhaler (albuterol ) with him day of surgery.  Pt verbalized understanding not to take losartan , lasix , potassium,  and supplements. And voiced understanding npo after midnight with exception clear liquids until 1045 (went over the liquids) and arrive at 1145.   Device orders were faxed to [facility]-- device clinic today as ugent (high priorty).

## 2023-11-28 ENCOUNTER — Encounter: Payer: Self-pay | Admitting: Urology

## 2023-11-28 ENCOUNTER — Ambulatory Visit (INDEPENDENT_AMBULATORY_CARE_PROVIDER_SITE_OTHER): Admitting: Urology

## 2023-11-28 VITALS — BP 148/82 | HR 64 | Ht 74.0 in | Wt 234.0 lb

## 2023-11-28 DIAGNOSIS — N401 Enlarged prostate with lower urinary tract symptoms: Secondary | ICD-10-CM | POA: Diagnosis not present

## 2023-11-28 DIAGNOSIS — N138 Other obstructive and reflux uropathy: Secondary | ICD-10-CM | POA: Diagnosis not present

## 2023-11-28 DIAGNOSIS — C61 Malignant neoplasm of prostate: Secondary | ICD-10-CM | POA: Diagnosis not present

## 2023-11-28 LAB — URINALYSIS, ROUTINE W REFLEX MICROSCOPIC
Bilirubin, UA: NEGATIVE
Glucose, UA: NEGATIVE
Ketones, UA: NEGATIVE
Leukocytes,UA: NEGATIVE
Nitrite, UA: NEGATIVE
Protein,UA: NEGATIVE
RBC, UA: NEGATIVE
Specific Gravity, UA: 1.015 (ref 1.005–1.030)
Urobilinogen, Ur: 0.2 mg/dL (ref 0.2–1.0)
pH, UA: 5.5 (ref 5.0–7.5)

## 2023-11-28 NOTE — Progress Notes (Signed)
 Assessment: 1. Prostate cancer (HCC);  high risk, T3bN1M0; on ADT   2. BPH with obstruction/lower urinary tract symptoms     Plan: Continue current management with ADT and abiraterone Continue tamsulosin   Procedure: The patient will be scheduled for placement of fiducial markers and prostate, SpaceOAR insertion at [facility] .  Surgical request is placed with the surgery schedulers and will be scheduled at the patient's/family request. Informed consent is given as documented below. Anesthesia: MAC  The patient does not have sleep apnea, history of MRSA, history of VRE, history of cardiac device requiring special anesthetic needs. Patient is stable and considered clear for surgical management in an outpatient ambulatory surgery setting as well as inpatient hospital setting.  Consent for Operation or Procedure: Provider Certification I hereby certify that the nature, purpose, benefits, usual and most frequent risks of, and alternatives to, the operation or procedure have been explained to the patient (or person authorized to sign for the patient) either by me as responsible physician or by the provider who is to perform the operation or procedure. Time spent such that the patient/family has had an opportunity to ask questions, and that those questions have been answered. The patient or the patient's representative has been advised that selected tasks may be performed by assistants to the primary health care provider(s). I believe that the patient (or person authorized to sign for the patient) understands what has been explained, and has consented to the operation or procedure. No guarantees were implied or made.  He will hold his Eliquis  for 2 days prior to procedure.  Cardiology clearance obtained. Fleets enema in AM of procedure.   Chief Complaint:  Chief Complaint  Patient presents with   Pre-op Exam    History of Present Illness:  Christian Lin is a 89 y.o. male who is seen for  continued evaluation of high risk prostate cancer. He was diagnosed with prostate cancer in December 2024.  He was initially seen by Dr. Steen with [facility] in October 2024.  Prostate examination at that time showed an enlarged prostate with nodularity bilaterally.  He had a history of elevated PSA to 14.29 in August 2024. MRI of the pelvis in October 2024 showed a PI-RADS 5 lesion in the right posterior peripheral zone and a PI-RADS 4 lesion in the right posterior peripheral zone at the apex.  There was also concern for early extraprostatic extension. Prostate volume was 129 ml. He underwent a prostate biopsy on 02/18/2023.  Biopsy showed Gleason 4+3= 7 within the right mid prostate, Gleason 4+3 = 7 within the right base, Gleason 4+4 = 8 in the right lateral mid prostate and Gleason 4+5 = 9 in the right lateral base. He received a 33-month dose of leuprolide on 03/04/2023. He was seen by Dr. Leita Dawn in radiation oncology in January 2025.  A PSMA PET scan was ordered which showed a focus of intense activity along the posterior aspect of the prostate gland with extension of activity into the right seminal vesicle and avid presacral lymph nodes compatible with nodal metastases. He was seen by Dr. Debby Mends with urologic oncology at [facility].  Management with radical prostatectomy as well as cryotherapy was not recommended. He has recently been followed by Dr. Madison with medical oncology.  He has continued on leuprolide and was started on abiraterone with prednisone  in February 2025.  PSA 3/25:  0.12  At his visit in April 2025, he continued on ADT + abiraterone and prednisone .  He  was tolerating these medications well.  No hot flashes.  He was on tamsulosin  with mild LUTS.  No dysuria or gross hematuria. IPSS = 7/0.   He reported intermittent rectal bleeding.   He has elected to proceed with radiation therapy.  He presents today for preoperative evaluation in preparation for placement  of fiducial markers and SpaceOAR on 12/02/2023. He can use on tamsulosin .  No new lower urinary tract symptoms.  He continues on ADT with Lupron and abiraterone. PSA from 8/25: 0.02   Portions of the above documentation were copied from a prior visit for review purposes only.   Past Medical History:  Past Medical History:  Diagnosis Date   Arthritis    Atrial fibrillation (HCC)    BPH with obstruction/lower urinary tract symptoms    Cardiomyopathy (HCC)    Dyslipidemia    Emphysema of lung (HCC)    Gout    Hyperlipidemia    Hypertension    Hypogonadism in male    Lung disease    Macular degeneration    Obstructive sleep apnea    Organic impotence    Pacemaker    Pacemaker    Skin cancer    Symptomatic bradycardia    Wears hearing aid in both ears     Past Surgical History:  Past Surgical History:  Procedure Laterality Date   avastin injections      cancerous lesion removal     CARDIAC PACEMAKER PLACEMENT     cervical fusion     COLONOSCOPY     cryoablation     cryoablation     EYE SURGERY     JOINT REPLACEMENT     NECK SURGERY     NECK SURGERY     PACEMAKER INSERTION     REPLACEMENT TOTAL KNEE     totoal knee replacement      VASECTOMY      Allergies:  Allergies  Allergen Reactions   Bee Pollen Swelling   Hctz [Hydrochlorothiazide]     Gout    Lisinopril Cough    Other Reaction(s): Cough    Family History:  Family History  Problem Relation Age of Onset   Heart disease Mother    Macular degeneration Mother    Alzheimer's disease Father    Macular degeneration Father    Colon cancer Other     Social History:  Social History   Tobacco Use   Smoking status: Former    Current packs/day: 2.50    Average packs/day: 2.5 packs/day for 20.0 years (50.0 ttl pk-yrs)    Types: Cigarettes   Smokeless tobacco: Never  Vaping Use   Vaping status: Never Used  Substance Use Topics   Alcohol use: Yes    Comment: 2-3 drinks daily   Drug use: No     ROS: Constitutional:  Negative for fever, chills, weight loss CV: Negative for chest pain, previous MI, hypertension Respiratory:  Negative for shortness of breath, wheezing, sleep apnea, frequent cough GI:  Negative for nausea, vomiting, bloody stool, GERD  Physical exam: BP (!) 148/82   Pulse 64   Ht 6' 2 (1.88 m)   Wt 234 lb (106.1 kg)   BMI 30.04 kg/m  GENERAL APPEARANCE:  Well appearing, well developed, well nourished, NAD HEENT:  Atraumatic, normocephalic, oropharynx clear NECK:  Supple without lymphadenopathy or thyromegaly ABDOMEN:  Soft, non-tender, no masses EXTREMITIES:  Moves all extremities well, without clubbing, cyanosis, or edema NEUROLOGIC:  Alert and oriented x 3, normal gait, CN II-XII  grossly intact MENTAL STATUS:  appropriate BACK:  Non-tender to palpation, No CVAT SKIN:  Warm, dry, and intact  Results: U/A: Negative

## 2023-11-28 NOTE — H&P (View-Only) (Signed)
 Assessment: 1. Prostate cancer (HCC);  high risk, T3bN1M0; on ADT   2. BPH with obstruction/lower urinary tract symptoms     Plan: Continue current management with ADT and abiraterone Continue tamsulosin   Procedure: The patient will be scheduled for placement of fiducial markers and prostate, SpaceOAR insertion at [facility] .  Surgical request is placed with the surgery schedulers and will be scheduled at the patient's/family request. Informed consent is given as documented below. Anesthesia: MAC  The patient does not have sleep apnea, history of MRSA, history of VRE, history of cardiac device requiring special anesthetic needs. Patient is stable and considered clear for surgical management in an outpatient ambulatory surgery setting as well as inpatient hospital setting.  Consent for Operation or Procedure: Provider Certification I hereby certify that the nature, purpose, benefits, usual and most frequent risks of, and alternatives to, the operation or procedure have been explained to the patient (or person authorized to sign for the patient) either by me as responsible physician or by the provider who is to perform the operation or procedure. Time spent such that the patient/family has had an opportunity to ask questions, and that those questions have been answered. The patient or the patient's representative has been advised that selected tasks may be performed by assistants to the primary health care provider(s). I believe that the patient (or person authorized to sign for the patient) understands what has been explained, and has consented to the operation or procedure. No guarantees were implied or made.  He will hold his Eliquis  for 2 days prior to procedure.  Cardiology clearance obtained. Fleets enema in AM of procedure.   Chief Complaint:  Chief Complaint  Patient presents with   Pre-op Exam    History of Present Illness:  Christian Lin is a 89 y.o. male who is seen for  continued evaluation of high risk prostate cancer. He was diagnosed with prostate cancer in December 2024.  He was initially seen by Dr. Steen with [facility] in October 2024.  Prostate examination at that time showed an enlarged prostate with nodularity bilaterally.  He had a history of elevated PSA to 14.29 in August 2024. MRI of the pelvis in October 2024 showed a PI-RADS 5 lesion in the right posterior peripheral zone and a PI-RADS 4 lesion in the right posterior peripheral zone at the apex.  There was also concern for early extraprostatic extension. Prostate volume was 129 ml. He underwent a prostate biopsy on 02/18/2023.  Biopsy showed Gleason 4+3= 7 within the right mid prostate, Gleason 4+3 = 7 within the right base, Gleason 4+4 = 8 in the right lateral mid prostate and Gleason 4+5 = 9 in the right lateral base. He received a 28-month dose of leuprolide on 03/04/2023. He was seen by Dr. Leita Dawn in radiation oncology in January 2025.  A PSMA PET scan was ordered which showed a focus of intense activity along the posterior aspect of the prostate gland with extension of activity into the right seminal vesicle and avid presacral lymph nodes compatible with nodal metastases. He was seen by Dr. Debby Mends with urologic oncology at [facility].  Management with radical prostatectomy as well as cryotherapy was not recommended. He has recently been followed by Dr. Madison with medical oncology.  He has continued on leuprolide and was started on abiraterone with prednisone  in February 2025.  PSA 3/25:  0.12  At his visit in April 2025, he continued on ADT + abiraterone and prednisone .  He  was tolerating these medications well.  No hot flashes.  He was on tamsulosin  with mild LUTS.  No dysuria or gross hematuria. IPSS = 7/0.   He reported intermittent rectal bleeding.   He has elected to proceed with radiation therapy.  He presents today for preoperative evaluation in preparation for placement  of fiducial markers and SpaceOAR on 12/02/2023. He can use on tamsulosin .  No new lower urinary tract symptoms.  He continues on ADT with Lupron and abiraterone. PSA from 8/25: 0.02   Portions of the above documentation were copied from a prior visit for review purposes only.   Past Medical History:  Past Medical History:  Diagnosis Date   Arthritis    Atrial fibrillation (HCC)    BPH with obstruction/lower urinary tract symptoms    Cardiomyopathy (HCC)    Dyslipidemia    Emphysema of lung (HCC)    Gout    Hyperlipidemia    Hypertension    Hypogonadism in male    Lung disease    Macular degeneration    Obstructive sleep apnea    Organic impotence    Pacemaker    Pacemaker    Skin cancer    Symptomatic bradycardia    Wears hearing aid in both ears     Past Surgical History:  Past Surgical History:  Procedure Laterality Date   avastin injections      cancerous lesion removal     CARDIAC PACEMAKER PLACEMENT     cervical fusion     COLONOSCOPY     cryoablation     cryoablation     EYE SURGERY     JOINT REPLACEMENT     NECK SURGERY     NECK SURGERY     PACEMAKER INSERTION     REPLACEMENT TOTAL KNEE     totoal knee replacement      VASECTOMY      Allergies:  Allergies  Allergen Reactions   Bee Pollen Swelling   Hctz [Hydrochlorothiazide]     Gout    Lisinopril Cough    Other Reaction(s): Cough    Family History:  Family History  Problem Relation Age of Onset   Heart disease Mother    Macular degeneration Mother    Alzheimer's disease Father    Macular degeneration Father    Colon cancer Other     Social History:  Social History   Tobacco Use   Smoking status: Former    Current packs/day: 2.50    Average packs/day: 2.5 packs/day for 20.0 years (50.0 ttl pk-yrs)    Types: Cigarettes   Smokeless tobacco: Never  Vaping Use   Vaping status: Never Used  Substance Use Topics   Alcohol use: Yes    Comment: 2-3 drinks daily   Drug use: No     ROS: Constitutional:  Negative for fever, chills, weight loss CV: Negative for chest pain, previous MI, hypertension Respiratory:  Negative for shortness of breath, wheezing, sleep apnea, frequent cough GI:  Negative for nausea, vomiting, bloody stool, GERD  Physical exam: BP (!) 148/82   Pulse 64   Ht 6' 2 (1.88 m)   Wt 234 lb (106.1 kg)   BMI 30.04 kg/m  GENERAL APPEARANCE:  Well appearing, well developed, well nourished, NAD HEENT:  Atraumatic, normocephalic, oropharynx clear NECK:  Supple without lymphadenopathy or thyromegaly ABDOMEN:  Soft, non-tender, no masses EXTREMITIES:  Moves all extremities well, without clubbing, cyanosis, or edema NEUROLOGIC:  Alert and oriented x 3, normal gait, CN II-XII  grossly intact MENTAL STATUS:  appropriate BACK:  Non-tender to palpation, No CVAT SKIN:  Warm, dry, and intact  Results: U/A: Negative

## 2023-12-02 ENCOUNTER — Encounter (HOSPITAL_COMMUNITY)
Admission: RE | Disposition: A | Discharge status: Home / Self Care | Payer: Self-pay | Source: Home / Self Care | Attending: Urology

## 2023-12-02 ENCOUNTER — Ambulatory Visit (HOSPITAL_COMMUNITY): Admitting: Anesthesiology

## 2023-12-02 ENCOUNTER — Ambulatory Visit (HOSPITAL_BASED_OUTPATIENT_CLINIC_OR_DEPARTMENT_OTHER): Admitting: Anesthesiology

## 2023-12-02 ENCOUNTER — Ambulatory Visit (HOSPITAL_COMMUNITY)
Admission: RE | Admit: 2023-12-02 | Discharge: 2023-12-02 | Disposition: A | Discharge status: Home / Self Care | Attending: Urology | Admitting: Urology

## 2023-12-02 ENCOUNTER — Encounter (HOSPITAL_COMMUNITY): Payer: Self-pay | Admitting: Urology

## 2023-12-02 ENCOUNTER — Other Ambulatory Visit: Payer: Self-pay

## 2023-12-02 DIAGNOSIS — I10 Essential (primary) hypertension: Secondary | ICD-10-CM | POA: Insufficient documentation

## 2023-12-02 DIAGNOSIS — C61 Malignant neoplasm of prostate: Secondary | ICD-10-CM | POA: Diagnosis present

## 2023-12-02 DIAGNOSIS — Z950 Presence of cardiac pacemaker: Secondary | ICD-10-CM | POA: Insufficient documentation

## 2023-12-02 DIAGNOSIS — Z87891 Personal history of nicotine dependence: Secondary | ICD-10-CM | POA: Diagnosis not present

## 2023-12-02 DIAGNOSIS — N138 Other obstructive and reflux uropathy: Secondary | ICD-10-CM | POA: Diagnosis not present

## 2023-12-02 DIAGNOSIS — Z7901 Long term (current) use of anticoagulants: Secondary | ICD-10-CM | POA: Diagnosis not present

## 2023-12-02 DIAGNOSIS — N401 Enlarged prostate with lower urinary tract symptoms: Secondary | ICD-10-CM | POA: Diagnosis not present

## 2023-12-02 DIAGNOSIS — I4891 Unspecified atrial fibrillation: Secondary | ICD-10-CM | POA: Diagnosis not present

## 2023-12-02 DIAGNOSIS — I480 Paroxysmal atrial fibrillation: Secondary | ICD-10-CM

## 2023-12-02 DIAGNOSIS — G4733 Obstructive sleep apnea (adult) (pediatric): Secondary | ICD-10-CM | POA: Insufficient documentation

## 2023-12-02 HISTORY — PX: GOLD SEED IMPLANT: SHX6343

## 2023-12-02 HISTORY — PX: SPACE OAR INSTILLATION: SHX6769

## 2023-12-02 LAB — BASIC METABOLIC PANEL WITH GFR
Anion gap: 9 (ref 5–15)
BUN: 25 mg/dL — ABNORMAL HIGH (ref 8–23)
CO2: 25 mmol/L (ref 22–32)
Calcium: 9.9 mg/dL (ref 8.9–10.3)
Chloride: 104 mmol/L (ref 98–111)
Creatinine, Ser: 1.42 mg/dL — ABNORMAL HIGH (ref 0.61–1.24)
GFR, Estimated: 48 mL/min — ABNORMAL LOW (ref >60)
Glucose, Bld: 114 mg/dL — ABNORMAL HIGH (ref 70–99)
Potassium: 3.7 mmol/L (ref 3.5–5.1)
Sodium: 138 mmol/L (ref 135–145)

## 2023-12-02 SURGERY — INSERTION, GOLD SEEDS
Anesthesia: Monitor Anesthesia Care

## 2023-12-02 MED ORDER — ACETAMINOPHEN 500 MG PO TABS
1000.0000 mg | ORAL_TABLET | Freq: Once | ORAL | Status: AC
Start: 2023-12-02 — End: 2023-12-02
  Administered 2023-12-02: 1000 mg via ORAL

## 2023-12-02 MED ORDER — ONDANSETRON HCL 4 MG/2ML IJ SOLN: 4.0000 mg | Freq: Once | INTRAMUSCULAR | Status: DC | PRN

## 2023-12-02 MED ORDER — ACETAMINOPHEN 500 MG PO TABS
ORAL_TABLET | ORAL | Status: AC
  Filled 2023-12-02: qty 2

## 2023-12-02 MED ORDER — CHLORHEXIDINE GLUCONATE 0.12 % MT SOLN
15.0000 mL | Freq: Once | OROMUCOSAL | Status: AC
  Administered 2023-12-02: 15 mL via OROMUCOSAL

## 2023-12-02 MED ORDER — LIDOCAINE HCL 2 % IJ SOLN
INTRAMUSCULAR | Status: DC | PRN
  Administered 2023-12-02: 10 mL

## 2023-12-02 MED ORDER — ORAL CARE MOUTH RINSE: 15.0000 mL | Freq: Once | OROMUCOSAL | Status: AC

## 2023-12-02 MED ORDER — LIDOCAINE HCL 2 % IJ SOLN
INTRAMUSCULAR | Status: AC
  Filled 2023-12-02: qty 20

## 2023-12-02 MED ORDER — FENTANYL CITRATE (PF) 250 MCG/5ML IJ SOLN
INTRAMUSCULAR | Status: DC | PRN
  Administered 2023-12-02: 25 ug via INTRAVENOUS
  Administered 2023-12-02: 50 ug via INTRAVENOUS

## 2023-12-02 MED ORDER — SODIUM CHLORIDE 0.9 % IV SOLN: INTRAVENOUS | Status: DC

## 2023-12-02 MED ORDER — FENTANYL CITRATE (PF) 100 MCG/2ML IJ SOLN: 25.0000 ug | INTRAMUSCULAR | Status: DC | PRN

## 2023-12-02 MED ORDER — CHLORHEXIDINE GLUCONATE 0.12 % MT SOLN
OROMUCOSAL | Status: AC
  Filled 2023-12-02: qty 15

## 2023-12-02 MED ORDER — SODIUM CHLORIDE (PF) 0.9 % IJ SOLN
INTRAMUSCULAR | Status: AC
  Filled 2023-12-02: qty 10

## 2023-12-02 MED ORDER — FENTANYL CITRATE (PF) 250 MCG/5ML IJ SOLN
INTRAMUSCULAR | Status: AC
  Filled 2023-12-02: qty 5

## 2023-12-02 MED ORDER — PROPOFOL 500 MG/50ML IV EMUL
INTRAVENOUS | Status: DC | PRN
  Administered 2023-12-02: 100 ug/kg/min via INTRAVENOUS

## 2023-12-02 MED ORDER — SODIUM CHLORIDE (PF) 0.9 % IJ SOLN
INTRAMUSCULAR | Status: DC | PRN
  Administered 2023-12-02: 5 mL

## 2023-12-02 MED ORDER — LIDOCAINE 2% (20 MG/ML) 5 ML SYRINGE
INTRAMUSCULAR | Status: AC
  Filled 2023-12-02: qty 5

## 2023-12-02 MED ORDER — PROPOFOL 10 MG/ML IV BOLUS
INTRAVENOUS | Status: AC
  Filled 2023-12-02: qty 20

## 2023-12-02 MED ORDER — DEXMEDETOMIDINE HCL IN NACL 80 MCG/20ML IV SOLN
INTRAVENOUS | Status: DC | PRN
  Administered 2023-12-02: 4 ug via INTRAVENOUS

## 2023-12-02 MED ORDER — SODIUM CHLORIDE 0.9 % IV SOLN
2.0000 g | INTRAVENOUS | Status: AC
  Administered 2023-12-02: 2 g via INTRAVENOUS
  Filled 2023-12-02: qty 20

## 2023-12-02 MED ORDER — ONDANSETRON HCL 4 MG/2ML IJ SOLN
INTRAMUSCULAR | Status: DC | PRN
  Administered 2023-12-02: 4 mg via INTRAVENOUS

## 2023-12-02 MED ORDER — OXYCODONE HCL 5 MG/5ML PO SOLN: 5.0000 mg | Freq: Once | ORAL | Status: DC | PRN

## 2023-12-02 MED ORDER — LACTATED RINGERS IV SOLN: INTRAVENOUS | Status: DC

## 2023-12-02 MED ORDER — OXYCODONE HCL 5 MG PO TABS: 5.0000 mg | ORAL_TABLET | Freq: Once | ORAL | Status: DC | PRN

## 2023-12-02 SURGICAL SUPPLY — 21 items
BLADE CLIPPER SENSICLIP SURGIC (BLADE) ×1 IMPLANT
BLANKET WARM UPPER BOD BAIR (MISCELLANEOUS) ×1 IMPLANT
CNTNR URN SCR LID CUP LEK RST (MISCELLANEOUS) ×1 IMPLANT
COVER BACK TABLE 60X90IN (DRAPES) ×1 IMPLANT
DRSG TEGADERM 4X4.75 (GAUZE/BANDAGES/DRESSINGS) ×1 IMPLANT
DRSG TEGADERM 8X12 (GAUZE/BANDAGES/DRESSINGS) ×1 IMPLANT
GAUZE SPONGE 4X4 12PLY STRL (GAUZE/BANDAGES/DRESSINGS) ×1 IMPLANT
GLOVE BIO SURGEON STRL SZ8 (GLOVE) ×1 IMPLANT
IMPL SPACEOAR SYSTEM 10ML (Spacer) IMPLANT
MARKER GOLD PRELOAD 1.2X3 (Urological Implant) ×1 IMPLANT
MARKER SKIN DUAL TIP RULER LAB (MISCELLANEOUS) ×1 IMPLANT
NDL SPNL 22GX3.5 QUINCKE BK (NEEDLE) ×1 IMPLANT
NEEDLE SPNL 22GX3.5 QUINCKE BK (NEEDLE) ×1 IMPLANT
SHEATH ULTRASOUND LF (SHEATH) IMPLANT
SHEATH ULTRASOUND LTX NONSTRL (SHEATH) IMPLANT
SLEEVE SCD COMPRESS KNEE MED (STOCKING) ×1 IMPLANT
SURGILUBE 2OZ TUBE FLIPTOP (MISCELLANEOUS) ×1 IMPLANT
SYR 10ML LL (SYRINGE) IMPLANT
SYR CONTROL 10ML LL (SYRINGE) ×1 IMPLANT
TOWEL OR 17X24 6PK STRL BLUE (TOWEL DISPOSABLE) ×1 IMPLANT
UNDERPAD 30X36 HEAVY ABSORB (UNDERPADS AND DIAPERS) ×1 IMPLANT

## 2023-12-02 NOTE — Discharge Instructions (Signed)
  Post Anesthesia Home Care Instructions  Activity: Get plenty of rest for the remainder of the day. A responsible individual must stay with you for 24 hours following the procedure.  For the next 24 hours, DO NOT: -Drive a car -Advertising copywriter -Drink alcoholic beverages -Take any medication unless instructed by your physician -Make any legal decisions or sign important papers.  Meals: Start with liquid foods such as gelatin or soup. Progress to regular foods as tolerated. Avoid greasy, spicy, heavy foods. If nausea and/or vomiting occur, drink only clear liquids until the nausea and/or vomiting subsides. Call your physician if vomiting continues.  Special Instructions/Symptoms: Your throat may feel dry or sore from the anesthesia or the breathing tube placed in your throat during surgery. If this causes discomfort, gargle with warm salt water. The discomfort should disappear within 24 hours.  May take Tylenol  beginning at 6:30 PM as needed for soreness/discomfort.

## 2023-12-02 NOTE — Interval H&P Note (Signed)
 History and Physical Interval Note:  12/02/2023 1:16 PM  Christian Lin  has presented today for surgery, with the diagnosis of Prostate cancer.  The various methods of treatment have been discussed with the patient and family. After consideration of risks, benefits and other options for treatment, the patient has consented to  Procedure(s): INSERTION, GOLD SEEDS (N/A) INJECTION, HYDROGEL SPACER (N/A) as a surgical intervention.  [name] history has been reviewed, patient examined, no change in status, stable for surgery.  I have reviewed the patient's chart and labs.  Questions were answered to the patient's satisfaction.     Adine Manly

## 2023-12-02 NOTE — Op Note (Signed)
 OPERATIVE NOTE   Patient Name: Christian Lin  MRN: [id number]   Date of Procedure: 12/02/23    Preoperative diagnosis:  Prostate cancer  Postoperative diagnosis:  Prostate cancer  Procedure:  Insertion of fiducial markers into prostate Insertion of SpaceOAR hydrogel  Attending: Adine DOROTHA Manly, MD  Anesthesia:  MAC with local  Estimated blood loss: none  Fluids: Per anesthesia record  Drains: none  Specimens: none  Antibiotics: Rocephin  1 gm IV   Indications:  57 YOM with high risk prostate cancer presents for insertion of fiducial markers in prostate and SpaceOAR hydrogel in preparation for radiation therapy. He is currently on ADT.   He is scheduled to begin XRT in near future. PSA from 9/25: 0.2. He presents today for the above procedure.  Risks and benefits discussed.  He understands and wishes to proceed.   He has held his anticoagulation for 2 days in preparation for procedure.  Description of Procedure:  The patient was administered preoperative antibiotics, placed in the dorsal lithotomy position, and prepped and draped in the usual sterile fashion. A preoperative time out was performed.  Next, transrectal ultrasonography was utilized to visualize the prostate. 10 cc of 0.25% bupivacaine was then used to infiltrate the subcuateous tissue of the perineum and an additional 10 cc was injected into the lateral apical tissue surrounding the prostate for a periprostatic nerve block.  Three gold fiducial markers were then placed into the prostate via transperineal needles under ultrasound guidance at the left apex, left base, and right mid gland under direct ultrasound guidance.  A site in the midline was then selected on the perineum for placement of an 18 g needle with saline.  The needle was advanced above the rectum and below Denonvillier's fascia to the mid gland and confirmed to be in the midline on transverse imaging.  One cc of saline was injected  confirming appropriate expansion of this space.  A total of 5-10 cc of saline was then injected to open the space further bilaterally.  The saline syringe was then removed and the SpaceOAR hydrogel was injected with good distribution bilaterally. He tolerated the procedure well and without complications. He was able to be awakened and transferred to the PACU in stable condition.   Complications: None  Condition: Stable, transferred to PACU  Plan:  D/C to home

## 2023-12-02 NOTE — Transfer of Care (Signed)
 Immediate Anesthesia Transfer of Care Note  Patient: Christian Lin  Procedure(s) Performed: INSERTION, GOLD SEEDS INJECTION, HYDROGEL SPACER  Patient Location: PACU  Anesthesia Type:MAC  Level of Consciousness: awake, alert , and oriented  Airway & Oxygen Therapy: Patient Spontanous Breathing and Patient connected to face mask oxygen  Post-op Assessment: Report given to RN and Post -op Vital signs reviewed and stable  Post vital signs: Reviewed and stable  Last Vitals:  Vitals Value Taken Time  BP 118/74 12/02/23 14:15  Temp 36.8 C 12/02/23 14:14  Pulse 74 12/02/23 14:16  Resp 16 12/02/23 14:16  SpO2 90 % 12/02/23 14:16  Vitals shown include unfiled device data.  Last Pain:  Vitals:   12/02/23 1221  TempSrc: Oral  PainSc: 0-No pain      Patients Stated Pain Goal: 3 (12/02/23 1221)  Complications: No notable events documented.

## 2023-12-02 NOTE — Anesthesia Postprocedure Evaluation (Signed)
 Anesthesia Post Note  Patient: Christian Lin  Procedure(s) Performed: INSERTION, GOLD SEEDS INJECTION, HYDROGEL SPACER     Patient location during evaluation: PACU Anesthesia Type: MAC Level of consciousness: awake and alert Pain management: pain level controlled Vital Signs Assessment: post-procedure vital signs reviewed and stable Respiratory status: spontaneous breathing, nonlabored ventilation and respiratory function stable Cardiovascular status: stable and blood pressure returned to baseline Anesthetic complications: no   No notable events documented.  Last Vitals:  Vitals:   12/02/23 1500 12/02/23 1515  BP: 131/84 (!) 121/59  Pulse: 77 65  Resp: 15 13  Temp:    SpO2: 94% 93%    Last Pain:  Vitals:   12/02/23 1515  TempSrc:   PainSc: 0-No pain                 Debby FORBES Like

## 2023-12-02 NOTE — Anesthesia Preprocedure Evaluation (Addendum)
 Anesthesia Evaluation  Patient identified by MRN, date of birth, ID band Patient awake    Reviewed: Allergy & Precautions, NPO status , Patient's Chart, lab work & pertinent test results, reviewed documented beta blocker date and time   History of Anesthesia Complications Negative for: history of anesthetic complications  Airway Mallampati: III  TM Distance: >3 FB Neck ROM: Full    Dental  (+) Dental Advisory Given   Pulmonary sleep apnea , COPD,  COPD inhaler, former smoker   Pulmonary exam normal        Cardiovascular hypertension, Pt. on medications and Pt. on home beta blockers Normal cardiovascular exam+ dysrhythmias Atrial Fibrillation + pacemaker (SSS)      Neuro/Psych  Hearing loss Vertigo   Neuromuscular disease  negative psych ROS   GI/Hepatic negative GI ROS,,, Gilbert's syndrome    Endo/Other   Obesity Pre-DM   Renal/GU negative Renal ROS    Prostate cancer     Musculoskeletal  (+) Arthritis ,    Abdominal   Peds  Hematology  On eliquis     Anesthesia Other Findings   Reproductive/Obstetrics                              Anesthesia Physical Anesthesia Plan  ASA: 3  Anesthesia Plan: MAC   Post-op Pain Management: Tylenol  PO (pre-op)*   Induction:   PONV Risk Score and Plan: 1 and Propofol  infusion, Treatment may vary due to age or medical condition and Ondansetron   Airway Management Planned: Natural Airway and Simple Face Mask  Additional Equipment: None  Intra-op Plan:   Post-operative Plan:   Informed Consent: I have reviewed the patients History and Physical, chart, labs and discussed the procedure including the risks, benefits and alternatives for the proposed anesthesia with the patient or authorized representative who has indicated his/her understanding and acceptance.       Plan Discussed with: CRNA and Anesthesiologist  Anesthesia Plan  Comments: (Mac vs. ETT pending discussion with surgeon and need for relaxation )         Anesthesia Quick Evaluation

## 2023-12-02 NOTE — Progress Notes (Signed)
 This morning day of surgery noted in epic had not received pt's device orders that were faxed to [facility] on 11-27-2023.  Called and spoke to Device clinic staff stated they had received a device order was given another fax number to re-fax orders to.  Re-faxed and now have received completed device orders via [employer] fax in epic ,  printed copy for chart.

## 2023-12-03 ENCOUNTER — Encounter (HOSPITAL_COMMUNITY): Payer: Self-pay | Admitting: Urology

## 2024-01-30 NOTE — Radiation Treatment Management (Signed)
°  WEEKLY ON TREATMENT VISIT NOTE - PROSTATE  Patient Name: Christian Lin [facility] #: [id number] Date of Birth: 05/17/1934  Diagnoses: (Z51.0) Encounter for antineoplastic radiation therapy (C61) Malignant neoplasm of prostate    (CMD)  Diagnosis; Prostate Cancer, High Risk, Gleason 4+5=9, PSA 14 (on finasteride ), stage cT3b cN1 cM0 Pacemaker  Urologist; Evalene Mullins/ Brad Stoneking Medical Oncologist; Selinda Albino  History;      Prior to 2004: Patient had prostate biopsies which were benign (at least 20 years before 2024).   November 01, 2022: PSA level was elevated at 14.29 ng/mL.    February 18, 2023: TRUS Volume:  88 ml Biopsies from Right: Gleason 9(4+5), 8, (4+3) =7 Left side: benign  March 04, 2023:Lupron 45 mg IM given  February 2025: Started Abiraterone/ Prednisone   Plan: 2 years combined androgen blockade RT to pelvic nodes/ prostate   IMRT: Prostate, Pelvis   Treatment Period Technique Fraction Dose Fractions Total Dose  Course 1 12/24/2023-01/30/2024  (days elapsed: 37)        1Pelvis/Prostate 12/24/2023-01/30/2024 VMAT 250 / 250 cGy 28 / 28 7,000 / 7,000 cGy      SUBJECTIVE:  Fatigue: Fatigue relieved by rest Anorexia: Absent or within normal limits Diarrhea: Increase of less than 4 stools per day over baseline OR mild increase in ostomy output compared to baseline Cystitis Noninfective: Absent or within normal limits Proctitis: Absent or within normal limits Urinary Frequency: Present Dysuria: Absent or within normal limits Overall feeling well.   Nocturia: 2 BPH Meds: none    OBJECTIVE: BP 111/72 (BP Location: Left arm, Patient Position: Sitting)   Pulse 60   Temp 97.3 F (36.3 C) (Oral)   Resp 18   Wt 108 kg (239 lb)   SpO2 95%   BMI 30.69 kg/m    ECOG Performance Status: 1 - Restricted in physically strenuous activity but ambulatory and able to carry out work of a light or sedentary nature, e.g., light house work, office work  [employer]  Readings from Last 3 Encounters:  01/30/24 108 kg (239 lb)  01/28/24 109 kg (241 lb 1.6 oz)  01/27/24 109 kg (240 lb)     Gen: No apparent distress.  Abd: No tenderness.  Skin: No erythema, No desquamation.  Ext: No lymphedema.      Behavioral Health Screening  Patient Health Questionnaire-2 Score: 0 (01/28/2024  2:40 PM)      Patient's Depression screening is Negative   Depression Plan: Normal/Negative Screening    ASSESSMENT:   Doing well at present dose.  PLAN:   Radiation therapy completed as planned  Chart check completed.  Portal imaging/IGRT reviewed, as appropriate.  Medications reviewed.  Pain: assessed above, no changes recommended.     Document Interface ID: #[email or web address]#

## 2024-02-03 NOTE — Progress Notes (Signed)
 Hematology/Oncology Follow-up Visit  Patient Name:  Christian Lin Date of Birth:  1934-07-01 Date of Encounter:  02/03/2024  Referring Provider:  No ref. provider found,   PCP:  Darnelle FORBES Lefevre, MD  Diagnoses: 1.  Invasive prostatic adenocarcinoma, grade group 5 (Gleason 4+5 = 9, right lateral base), perineural invasion identified, multifocal, possible right seminal vesicle involvement based on PSMA PET scan, presacral nodal involvement by PSMA PET.  AJCC stage IVa.  Treatment: 1.  Leuprolide 45 mg IM, initiated 03/04/2023.  2.  Abiraterone 1000 mg p.o. daily plus Prednisone  5 mg daily, initiated 04/25/2023.  3.  EBRT to pelvis and prostate, 7000 cGy, 28 fractions, completed 01/30/2024.    Hematologic/Oncologic History: Christian Lin is a 89 y.o. who is seen in consultation at the request of Steen Evalene Duos, * for an evaluation of high risk, grade group 5 adenocarcinoma of the prostate, AJCC clinical T3b N1 M0, original date of consultation 04/01/2023.  Mr. Galentine has a past medical history significant for atrial fibrillation and macular degeneration.  He has a pacemaker in place due to his atrial fibrillation according to his history.  He was found to have an elevated PSA of 14.29 on 11/01/2022.  He saw Dr. Steen in urology on 12/18/2022.  On prostate examination, he noted an enlarged prostate with nodularity at apex bilaterally.    MRI of the pelvis 12/31/2022 revealed a PI-RADS 5 lesion in the right posterior peripheral zone at the level of the mid gland/apex and PI-RADS 4 lesion in the right posterior peripheral zone at the apex.  There was broad contact with margin concerning for early extraprostatic extension.  Transrectal ultrasound-guided biopsy of the prostate was performed on 02/18/2023.  This revealed invasive prostatic adenocarcinoma, Gleason 4+3 = 7 within the right mid prostate, Gleason 4+3 = 7 within the right base, Gleason 4+4 = 8 in the right lateral  mid prostate, and Gleason 4+5 = 9 in the right lateral base.  The remainder of the biopsies were negative.  He received his first dose of leuprolide under the guidance of Dr. Steen on 03/04/2023.  He saw Dr. Leita Dawn in radiation oncology for a consultation on 03/24/2023.  She ordered a PSMA PET CT scan which was performed on 03/27/2023.  The prostate gland was enlarged with nodular focus of intense activity along the posterior aspect of the gland with extension of activity into the right seminal vesicle compatible with primary prostate neoplasm gland with probable extraprostatic extension.  There were radiotracer avid presacral lymph nodes compatible with nodal metastases.  He saw Dr. Debby Mends in urologic oncology at [facility] on 03/31/2023.  Dr. Mends has recommended against RP and pelvic lymph node dissection as well as cryotherapy.  Interim Note:  Christian Lin returns today for follow up of high risk locally advanced, nonmetastatic castration sensitive prostate cancer.  This is a scheduled follow-up visit.  He finished radiation on 04/01/2023.  He had a lot of fatigue last week but this seems to be getting better.  He is urinating a lot throughout the day.  He does not urinate much at night.  No fevers.  ECOG performance status 2.  Review of Systems: All other systems are negative.  Medications:   Current Outpatient Medications:    abiraterone (ZYTIGA) 250 mg tablet, Take 4 tablets (1,000 mg total) by mouth daily. Take on a empty stomach with a full glass of water., Disp: 360 tablet, Rfl: 3   albuterol  2.5 mg /3  mL (0.083 %) nebulizer solution, Take 2.5 mg by nebulization every 6 (six) hours as needed for wheezing., Disp: 300 mL, Rfl: 1   albuterol  HFA (PROVENTIL  HFA;VENTOLIN  HFA;PROAIR  HFA) 90 mcg/actuation inhaler, , Disp: , Rfl:    amoxicillin (AMOXIL) 500 mg capsule, TAKE 4 CAPSULES BY MOUTH 1 HOUR PRIOR TO DENTAL APPOINTMENT (Patient taking differently: as needed.), Disp:  , Rfl:    apixaban  (Eliquis ) 5 mg tab, Take 5 mg by mouth 2 (two) times a day. Indications: treatment to prevent blood clots in chronic atrial fibrillation, Disp: 180 tablet, Rfl: 3   atorvastatin  (LIPITOR) 10 mg tablet, Take 10 mg by mouth daily., Disp: , Rfl:    cholecalciferol (VITAMIN D3) 2,000 unit cap capsule, Take 1 capsule by mouth daily., Disp: , Rfl:    ciclesonide 160 mcg/actuation HFAA, Take by mouth., Disp: , Rfl:    colchicine 0.6 mg tablet, 1 bid for 1 day, then 1 a day for 4-5 days (Patient taking differently: as needed.), Disp: 30 tablet, Rfl: 0   dilTIAZem  (CARDIZEM  CD) 180 mg 24 hr capsule, Take 180 mg by mouth daily., Disp: , Rfl:    dilTIAZem  (CARDIZEM  CD) 180 mg 24 hr capsule, Take 180 mg by mouth daily., Disp: , Rfl:    dorzolamide-timoloL (COSOPT) 22.3-6.8 mg/mL ophthalmic solution, Administer 1 drop into both eyes 2 (two) times a day., Disp: 10 mL, Rfl: 11   EPINEPHrine (EPIPEN) 0.3 mg/0.3 mL injection syringe, 0.3 mg into the thigh once as needed., Disp: , Rfl:    erythromycin (ROMYCIN) 5 mg/gram (0.5 %) ophthalmic ointment, Apply 0.5 inches to both eyes at bedtime., Disp: 3.5 g, Rfl: 11   finasteride  (PROSCAR ) 5 mg tablet, Take 5 mg by mouth daily., Disp: , Rfl:    fluticasone propionate (FLONASE) 50 mcg/spray nasal spray, Administer 2 sprays into each nostril daily., Disp: , Rfl:    fluticasone-umeclidinium-vilanterol (Trelegy Ellipta) 100-62.5-25 mcg inhaler, Inhale 1 puff in the morning. 1 years worth., Disp: 3 each, Rfl: 3   furosemide  (LASIX ) 20 mg tablet, Indications: visible water retention. 1 po bid , ok to take extra x 3-5 days if wt increases by 3 lbs in 1 day or 5 lbs in 1 month, Disp: , Rfl:    losartan  (COZAAR ) 100 mg tablet, Take 50 mg by mouth Once Daily., Disp: 90 tablet, Rfl:    meclizine  (ANTIVERT ) 25 mg tablet, 1/2 - 1 po up to bid prn vertigo (Patient taking differently: as needed.), Disp: 30 tablet, Rfl: 1   potassium chloride 20 mEq  ER tablet, 1 and 1/2 tab daily to total 30 mEq, Disp: 135 tablet, Rfl: 3   predniSONE  (DELTASONE ) 5 mg tablet, Take 1 tablet (5 mg total) by mouth daily., Disp: 90 tablet, Rfl: 3   senna (SENOKOT) 8.6 mg tablet, Take 1 tablet by mouth daily as needed., Disp: , Rfl:    sotaloL  (BETAPACE ) 80 mg tablet, Take 1 tablet (80 mg total) by mouth every morning AND 1.5 tablets (120 mg total) at bedtime., Disp: 225 tablet, Rfl: 3   tamsulosin  (FLOMAX ) 0.4 mg cap, Take 0.4 mg by mouth daily., Disp: , Rfl:    tiotropium-olodateroL (STIOLTO RESPIMAT) 2.5-2.5 mcg/actuation inhaler, Take by mouth., Disp: , Rfl:    triamcinolone (KENALOG) 0.1 % cream, Apply topically., Disp: , Rfl:    vitamins A,C,E-zinc-copper (PreserVision AREDS) 4,296 mcg-226 mg-90 mg cap, Take 1 capsule by mouth 2 (two) times a day., Disp: , Rfl:    cyanocobalamin (VITAMIN B12) 1,000 mcg tablet,  Take 1,000 mcg by mouth Once Daily., Disp: 90 tablet, Rfl: 3  Past medical history, allergies, current medications, social history and family history were reviewed and updated when appropriate.  Objective:  BP 100/62   Pulse 71   Temp 97.7 F (36.5 C) (Oral)   Resp 16   Wt 108 kg (237 lb 6.4 oz)   SpO2 93%   BMI 30.48 kg/m  General: Well-spoken, nontoxic-appearing Caucasian male, no acute distress.  His wife is with him today.  Wt Readings from Last 5 Encounters:  02/03/24 108 kg (237 lb 6.4 oz)  01/30/24 108 kg (239 lb)  01/28/24 109 kg (241 lb 1.6 oz)  01/27/24 109 kg (240 lb)  01/20/24 110 kg (242 lb)    Results:   Results for orders placed or performed in visit on 02/03/24  Comprehensive Metabolic Panel   Collection Time: 02/03/24 10:11 AM  Result Value Ref Range   Sodium 141 136 - 145 mmol/L   Potassium 3.8 3.4 - 4.5 mmol/L   Chloride 105 98 - 107 mmol/L   CO2 30 21 - 31 mmol/L   Anion Gap 6 6 - 14 mmol/L   Glucose, Random 142 (H) 70 - 99 mg/dL   Blood Urea Nitrogen (BUN) 26 (H) 7 - 25 mg/dL   Creatinine 8.69 9.29  - 1.30 mg/dL   eGFR 53 (L) >40 fO/fpw/8.26f7   Albumin 4.2 3.5 - 5.7 g/dL   Total Protein 5.8 (L) 6.4 - 8.9 g/dL   Bilirubin, Total 2.0 (H) 0.3 - 1.0 mg/dL   Alkaline Phosphatase (ALP) 89 34 - 104 U/L   Aspartate Aminotransferase (AST) 12 (L) 13 - 39 U/L   Alanine Aminotransferase (ALT) 10 7 - 52 U/L   Calcium  10.2 8.6 - 10.3 mg/dL   BUN/Creatinine Ratio    PSA, Total   Collection Time: 02/03/24 10:11 AM  Result Value Ref Range   PSA, Total 0.02 0.00 - 6.50 ng/mL  CBC with Differential   Collection Time: 02/03/24 10:11 AM  Result Value Ref Range   WBC 5.36 4.40 - 11.00 10*3/uL   RBC 3.46 (L) 4.50 - 5.90 10*6/uL   Hemoglobin 12.4 (L) 14.0 - 17.5 g/dL   Hematocrit 65.5 (L) 58.4 - 50.4 %   Mean Corpuscular Volume (MCV) 99.5 (H) 80.0 - 96.0 fL   Mean Corpuscular Hemoglobin (MCH) 35.8 (H) 27.5 - 33.2 pg   Mean Corpuscular Hemoglobin Conc (MCHC) 36.0 33.0 - 37.0 g/dL   Red Cell Distribution Width (RDW) 15.5 12.3 - 17.0 %   Platelet Count (PLT) 119 (L) 150 - 450 10*3/uL   Mean Platelet Volume (MPV) 6.6 (L) 6.8 - 10.2 fL   Neutrophils % 79 %   Lymphocytes % 11 %   Monocytes % 7 %   Eosinophils % 2 %   Basophils % 1 %   Neutrophils Absolute 4.20 1.80 - 7.80 10*3/uL   Lymphocytes # 0.60 (L) 1.00 - 4.80 10*3/uL   Monocytes # 0.40 0.00 - 0.80 10*3/uL   Eosinophils # 0.10 0.00 - 0.50 10*3/uL   Basophils # 0.00 0.00 - 0.20 10*3/uL  Urinalysis with Reflex to Microscopic   Collection Time: 02/03/24 11:38 AM  Result Value Ref Range   Color, Urine Yellow Yellow   Clarity, Urine Clear Clear, Hazy   Specific Gravity, Urine 1.015 1.005 - 1.030   pH, Urine 6.0 5.0 - 8.0   Protein, Urine Negative Negative mg/dL   Glucose, Urine Negative Negative mg/dL   Ketones, Urine Negative Negative  mg/dL   Bilirubin, Urine Negative Negative   Blood, Urine Negative Negative   Nitrite, Urine Negative Negative   Leukocyte Esterase, Urine Negative Negative   Urobilinogen, Urine 1.0 <2.0 mg/dL   *Note:  Due to a large number of results and/or encounters for the requested time period, some results have not been displayed. A complete set of results can be found in Results Review.   Lab Results  Component Value Date   PSA 0.02 02/03/2024   PSA 0.02 11/12/2023   PSA 0.03 09/03/2023   Radiology:   ECG 12 lead Ventricular Rate                   80        BPM                  Atrial Rate                        80        BPM                  P-R Interval                       254       ms                   QRS Duration                       136       ms                   Q-T Interval                       422       ms                   QTC Calculation Bazett             486       ms                   Calculated P Axis                  74        degrees              Calculated R Axis                  54        degrees              Calculated T Axis                  268       degrees               Atrial-paced rhythm with prolonged AV conduction Right bundle branch block When compared with ECG of 17-Oct-2022 13 14, Premature ventricular complexes are no longer present Confirmed by Rojelio Dunnings  [id number]  on 01-28-2024 3 06 33 PM    Assessment and Plan:  1.  Prostate cancer, grade group 5, clinical T3 N1: His fatigue is getting better so we will continue abiraterone and leuprolide.  He is due Lupron tomorrow so we will schedule.  Continue abiraterone at standard doses.  Continue supportive prednisone .  I will see him back in 3 months.  PSA remains stable at 0.02.  2.  Urinary frequency: Urinalysis today looks unremarkable.  I will message Dr. Dasie to get her thoughts about how we might help mitigate his symptoms, assuming this is related to radiation.  In the interim, he is encouraged to call if questions, concerns, or problems arise.  In total, 30 minutes of time was spent on this encounter, greater than 50% of which was spent face-to-face with Vicenta LITTIE Schooner.  Non-face-to-face  activities may include but are not necessarily limited to reviewing medical records, entering orders, reviewing and signing treatment/chemotherapy orders, reviewing and interpreting imaging studies and labs, as well as medical documentation.  This document was created using the aid of voice recognition Dragon dictation software, please excuse any sound alike substitutions, typographic or transcription errors.  Efforts have been made to correct these dictation errors, however some may persist, and this does not reflect the standard of medical care.  If there are any questions please do not hesitate to contact me for clarification.

## 2024-02-05 NOTE — Progress Notes (Signed)
 Injection Documentation  Injection received today: Lupron  Patient Tolerance & Observations:   Patient tolerated: Yes  Adverse reactions observed: None  Provider Communication:   Spoke with provider during visit: No  Provider spoken with:  Topics discussed: N/A  Patient Comments/Concerns:  Pt concerned with left ankle joint pain that started a few days ago.  Pt states he just finished his radiation treatments and was instructed to notify if develops any joint pain.  Radiation RN, Lavaun Mantle contacted and states she is going to make Dr. Dasie aware and will contact patient if needed.  Pt made aware.  Disposition:   Discharged: Home Status: Ambulatory             Self Follow-up appointment date: 05/04/24 Was the AVS given: No, patient declined.  Additional Notes:   No additional comments or concerns.

## 2024-02-26 ENCOUNTER — Encounter: Payer: Self-pay | Admitting: Urology

## 2024-02-26 ENCOUNTER — Ambulatory Visit (INDEPENDENT_AMBULATORY_CARE_PROVIDER_SITE_OTHER): Admitting: Urology

## 2024-02-26 VITALS — BP 122/65 | HR 65 | Ht 74.0 in | Wt 235.0 lb

## 2024-02-26 DIAGNOSIS — C61 Malignant neoplasm of prostate: Secondary | ICD-10-CM | POA: Diagnosis not present

## 2024-02-26 DIAGNOSIS — N138 Other obstructive and reflux uropathy: Secondary | ICD-10-CM | POA: Diagnosis not present

## 2024-02-26 DIAGNOSIS — N401 Enlarged prostate with lower urinary tract symptoms: Secondary | ICD-10-CM | POA: Diagnosis not present

## 2024-02-26 LAB — URINALYSIS, ROUTINE W REFLEX MICROSCOPIC
Bilirubin, UA: NEGATIVE
Glucose, UA: NEGATIVE
Ketones, UA: NEGATIVE
Leukocytes,UA: NEGATIVE
Nitrite, UA: NEGATIVE
Protein,UA: NEGATIVE
RBC, UA: NEGATIVE
Specific Gravity, UA: 1.015 (ref 1.005–1.030)
Urobilinogen, Ur: 1 mg/dL (ref 0.2–1.0)
pH, UA: 7 (ref 5.0–7.5)

## 2024-02-26 NOTE — Progress Notes (Signed)
 Assessment: 1. Prostate cancer (HCC);  high risk, T3bN1M0; on ADT   2. BPH with obstruction/lower urinary tract symptoms     Plan: Continue current management with ADT and abiraterone Continue tamsulosin  and finasteride  Return to office in 6 months   Chief Complaint:  Chief Complaint  Patient presents with   Prostate Cancer    History of Present Illness:  Christian Lin is a 89 y.o. male who is seen for continued evaluation of high risk prostate cancer. He was diagnosed with prostate cancer in December 2024.  He was initially seen by Dr. Steen with [facility] urology in October 2024.  Prostate examination at that time showed an enlarged prostate with nodularity bilaterally.  He had a history of elevated PSA to 14.29 in August 2024. MRI of the pelvis in October 2024 showed a PI-RADS 5 lesion in the right posterior peripheral zone and a PI-RADS 4 lesion in the right posterior peripheral zone at the apex.  There was also concern for early extraprostatic extension. Prostate volume was 129 ml. He underwent a prostate biopsy on 02/18/2023.  Biopsy showed Gleason 4+3= 7 within the right mid prostate, Gleason 4+3 = 7 within the right base, Gleason 4+4 = 8 in the right lateral mid prostate and Gleason 4+5 = 9 in the right lateral base. He received a 73-month dose of leuprolide on 03/04/2023. He was seen by Dr. Leita Dawn in radiation oncology in January 2025.  A PSMA PET scan was ordered which showed a focus of intense activity along the posterior aspect of the prostate gland with extension of activity into the right seminal vesicle and avid presacral lymph nodes compatible with nodal metastases. He was seen by Dr. Debby Mends with urologic oncology at [facility].  Management with radical prostatectomy as well as cryotherapy was not recommended. He has recently been followed by Dr. Madison with medical oncology.  He has continued on leuprolide and was started on abiraterone with prednisone  in  February 2025.  PSA 3/25:  0.12  At his visit in April 2025, he continued on ADT + abiraterone and prednisone .  He was tolerating these medications well.  No hot flashes.  He was on tamsulosin  with mild LUTS.  No dysuria or gross hematuria. IPSS = 7/0.   He reported intermittent rectal bleeding.   He elected to proceed with radiation therapy.   He underwent placement of fiducial markers and SpaceOAR on 12/02/2023. He continues on tamsulosin .  No new lower urinary tract symptoms.  He continued on ADT with Lupron and abiraterone. PSA from 8/25: 0.02 PSA from 11/25:  0.02  He completed radiation therapy on 01/30/24. He received Lupron on 02/05/24.  He returns today for follow-up.  He continues on finasteride  and tamsulosin .  His lower urinary tract symptoms are fairly well-controlled.  No dysuria or gross hematuria. IPSS = 6/2.   Portions of the above documentation were copied from a prior visit for review purposes only.   Past Medical History:  Past Medical History:  Diagnosis Date   Arthritis    Atrial fibrillation (HCC)    BPH with obstruction/lower urinary tract symptoms    Cardiomyopathy (HCC)    Dyslipidemia    Emphysema of lung (HCC)    Gout    Hyperlipidemia    Hypertension    Hypogonadism in male    Lung disease    Macular degeneration    Obstructive sleep apnea    Organic impotence    Pacemaker    Pacemaker  Skin cancer    Symptomatic bradycardia    Wears hearing aid in both ears     Past Surgical History:  Past Surgical History:  Procedure Laterality Date   avastin injections      cancerous lesion removal     CARDIAC PACEMAKER PLACEMENT     cervical fusion     COLONOSCOPY     cryoablation     cryoablation     EYE SURGERY     GOLD SEED IMPLANT N/A 12/02/2023   Procedure: INSERTION, GOLD SEEDS;  Surgeon: Roseann Adine PARAS., MD;  Location: [facility] OR;  Service: Urology;  Laterality: N/A;   JOINT REPLACEMENT     NECK SURGERY     NECK SURGERY      PACEMAKER INSERTION     REPLACEMENT TOTAL KNEE Bilateral    SPACE OAR INSTILLATION N/A 12/02/2023   Procedure: INJECTION, HYDROGEL SPACER;  Surgeon: Roseann Adine PARAS., MD;  Location: [facility] OR;  Service: Urology;  Laterality: N/A;   totoal knee replacement      VASECTOMY      Allergies:  Allergies  Allergen Reactions   Bee Pollen Swelling   Hctz [Hydrochlorothiazide]     Gout    Lisinopril Cough    Other Reaction(s): Cough    Family History:  Family History  Problem Relation Age of Onset   Heart disease Mother    Macular degeneration Mother    Alzheimer's disease Father    Macular degeneration Father    Colon cancer Other     Social History:  Social History   Tobacco Use   Smoking status: Former    Current packs/day: 2.50    Average packs/day: 2.5 packs/day for 20.0 years (50.0 ttl pk-yrs)    Types: Cigarettes   Smokeless tobacco: Never  Vaping Use   Vaping status: Never Used  Substance Use Topics   Alcohol use: Yes    Comment: 2-3 drinks daily   Drug use: No    ROS: Constitutional:  Negative for fever, chills, weight loss CV: Negative for chest pain, previous MI, hypertension Respiratory:  Negative for shortness of breath, wheezing, sleep apnea, frequent cough GI:  Negative for nausea, vomiting, bloody stool, GERD  Physical exam: BP 122/65   Pulse 65   Ht 6' 2 (1.88 m)   Wt 235 lb (106.6 kg)   BMI 30.17 kg/m  GENERAL APPEARANCE:  Well appearing, well developed, well nourished, NAD HEENT:  Atraumatic, normocephalic, oropharynx clear NECK:  Supple without lymphadenopathy or thyromegaly ABDOMEN:  Soft, non-tender, no masses EXTREMITIES:  Moves all extremities well, without clubbing, cyanosis, or edema NEUROLOGIC:  Alert and oriented x 3, normal gait, CN II-XII grossly intact MENTAL STATUS:  appropriate BACK:  Non-tender to palpation, No CVAT SKIN:  Warm, dry, and intact  Results: U/A: Negative

## 2024-09-02 ENCOUNTER — Ambulatory Visit: Admitting: Urology
